# Patient Record
Sex: Female | Born: 1990 | Race: White | Hispanic: No | Marital: Single | Smoking: Never smoker
Health system: Southern US, Community
[De-identification: ages and names within clinical notes are randomized; demographics above are authoritative.]

## PROBLEM LIST (undated history)

## (undated) DIAGNOSIS — R29898 Other symptoms and signs involving the musculoskeletal system: Secondary | ICD-10-CM

## (undated) DIAGNOSIS — R87610 Atypical squamous cells of undetermined significance on cytologic smear of cervix (ASC-US): Secondary | ICD-10-CM

## (undated) DIAGNOSIS — G43909 Migraine, unspecified, not intractable, without status migrainosus: Secondary | ICD-10-CM

## (undated) DIAGNOSIS — M6289 Other specified disorders of muscle: Secondary | ICD-10-CM

## (undated) DIAGNOSIS — A4902 Methicillin resistant Staphylococcus aureus infection, unspecified site: Secondary | ICD-10-CM

## (undated) DIAGNOSIS — J309 Allergic rhinitis, unspecified: Secondary | ICD-10-CM

## (undated) DIAGNOSIS — G808 Other cerebral palsy: Secondary | ICD-10-CM

## (undated) DIAGNOSIS — L309 Dermatitis, unspecified: Secondary | ICD-10-CM

## (undated) HISTORY — DX: Other symptoms and signs involving the musculoskeletal system: R29.898

## (undated) HISTORY — DX: Other cerebral palsy: G80.8

## (undated) HISTORY — DX: Allergic rhinitis, unspecified: J30.9

## (undated) HISTORY — DX: Migraine, unspecified, not intractable, without status migrainosus: G43.909

## (undated) HISTORY — DX: Methicillin resistant Staphylococcus aureus infection, unspecified site: A49.02

## (undated) HISTORY — PX: OTHER SURGICAL HISTORY: SHX169

## (undated) HISTORY — DX: Dermatitis, unspecified: L30.9

## (undated) HISTORY — DX: Other specified disorders of muscle: M62.89

## (undated) HISTORY — DX: Atypical squamous cells of undetermined significance on cytologic smear of cervix (ASC-US): R87.610

---
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
## 2010-12-12 NOTE — Procedures (Signed)
ORDERING PHYSICIAN:  Dr. Ellison Carwin.     Cath   EAV:[id number]  D:  06/13/2004 13:50:06  T:  06/14/2004 08:25:52  Job #:  [id number]

## 2010-12-12 NOTE — Procedures (Signed)
HISTORY:  This is an 20 year old patient with a history of cerebral palsy  with an episode of loss of consciousness for 5 minutes occurred 3 to 4 days  prior to this evaluation.  The patient is being evaluated for possible  seizures.   This is a routine EEG.  No skull defects are noted.   MEDICATIONS:  Not listed.   EEG CLASSIFICATION:  Dysrhythmia grade 3 posterior.   DESCRIPTION OF RECORDING AND BACKGROUND RHYTHMS:  This recording consists of  a fairly well-modulated, medium amplitude alpha rhythm of 8 Hz.  It is  reactive to eye opening and closure.  As the record progresses, the patient  appears to remain in an awakened state throughout the recording.  Photic  stimulation is performed and at 9 Hz stimulation, there appears to be a  paroxysmal discharge associated with sharp and slow wave complexes emanating  primarily from the posterior regions.  True electrographic seizure is not  recorded.  This activity is not seen at other photic stimulation  frequencies.  Hyperventilation results in a minimal buildup of  _________  rhythm activities without significant slowing seen.  EKG monitor shows no  evidence of cardiac rhythm abnormalities with a heart rate of 66.   IMPRESSION:  This is an abnormal EEG recorded to paroxysmal sharp and slow  wave discharges seen during 9 Hz photic stimulation.  This is seen primarily  in the posterior regions and is a study consistent with a lowered seizure  threshold, although no actual electrographic seizures were recorded.      MVH:QION  D:  05/21/2004 11:15:34  T:  05/21/2004 12:17:08  Job #:  [id number]   cc:   Mila Homer. Sudie Bailey, M.D.  [address] [place], [place] [address]  Fax: [phone]

## 2011-03-11 ENCOUNTER — Ambulatory Visit (INDEPENDENT_AMBULATORY_CARE_PROVIDER_SITE_OTHER): Payer: Medicaid Other | Admitting: Orthopedic Surgery

## 2011-03-11 ENCOUNTER — Encounter: Payer: Self-pay | Admitting: Orthopedic Surgery

## 2011-03-11 VITALS — Resp 16 | Ht 63.0 in | Wt 106.0 lb

## 2011-03-11 DIAGNOSIS — S93409A Sprain of unspecified ligament of unspecified ankle, initial encounter: Secondary | ICD-10-CM

## 2011-03-11 NOTE — Patient Instructions (Signed)
Apply weight as tolerated to the LEFT foot  No strenuous activity or excessive walking  Wear ASO brace for all ambulation

## 2011-03-11 NOTE — Progress Notes (Signed)
Chief complaint: Pain LEFT ankle x4 days HPI:(4) The patient was referred from Dr. Tenna Delaine office with a possible avulsion fracture of the cuboid but primarily for ankle sprain evaluation and management.  The patient has hypotonia from previous cerebral palsy.  She injured the LEFT ankle secondary to a fall on August 11.  Her mother says the ankle is swollen like a balloon.  She was placed in an air cast but it doesn't stay on well.  She complains of dull constant ankle pain with bruising and swelling along the lateral foot and ankle.  She is able to bear weight.  ROS:(2) Seasonal ALLERGIES, neurologic symptoms negative  PFSH: (1) Autism, hypotonia from cerebral palsy.  Family history of heart disease diabetes and cancer.  Physical Exam(12) GENERAL: normal grooming and hygiene with normal full body development  CDV: pulses are normal   Skin: normal  Lymph: nodes were not palpable/normal  Psychiatric: awake, alert and oriented  Neuro: normal sensation  MSK Ambulation is noted without assistive device other than the Aircast 1There is pes planus of the foot but normal ankle range of motion.  There is tenderness and swelling and subcutaneous bruising of the skin along the lateral foot and ankle with tenderness noted over the cuboid and anterior talofibular ligament.  The bone itself is nontender.  Plantarflexion and dorsiflexion strength are normal.  There is a large anterior drawer test which may be chronic.   X-ray reevaluation and report including the film on a disc show no fracture there is irregularity along the lateral cuboid Assessment: Grade 2 ankle sprain    Plan: Change the ASO brace, weightbearing as tolerated, decrease activities, followup in 4 weeks reevaluate the ankle and see if therapy needed

## 2011-04-08 ENCOUNTER — Encounter: Payer: Self-pay | Admitting: Orthopedic Surgery

## 2011-04-08 ENCOUNTER — Ambulatory Visit (INDEPENDENT_AMBULATORY_CARE_PROVIDER_SITE_OTHER): Payer: Medicaid Other | Admitting: Orthopedic Surgery

## 2011-04-08 VITALS — Ht 63.0 in | Wt 106.0 lb

## 2011-04-08 DIAGNOSIS — S93409A Sprain of unspecified ligament of unspecified ankle, initial encounter: Secondary | ICD-10-CM

## 2011-04-08 NOTE — Patient Instructions (Signed)
Wear brace for 2 more weeks then return to normal shoe wear no brace needed

## 2011-04-08 NOTE — Progress Notes (Signed)
Chief complaint LEFT ankle sprain followup  History patient injured LEFT ankle 4 weeks ago was placed in a brace there was a calcaneocuboid avulsion as well.  Patient reports symptoms have improved significantly.  Review of systems no catching locking or giving way.  Exam shows she is ambulating without a limp.  She has no tenderness or swelling.  Ankle range of motion is normal.  Muscle strength and muscle tone are returning towards normal.  There is a slight anterior drawer with a firm endpoint.  Distal color and perfusion are normal.  Impression resolving ankle sprain  Secondary to the hypotonia recommend 2 more weeks in the brace and then the brace can be removed.

## 2013-04-02 ENCOUNTER — Encounter (HOSPITAL_COMMUNITY): Payer: Self-pay | Admitting: Emergency Medicine

## 2013-04-02 ENCOUNTER — Emergency Department (HOSPITAL_COMMUNITY)
Admission: EM | Admit: 2013-04-02 | Discharge: 2013-04-02 | Disposition: A | Discharge status: Home / Self Care | Payer: Medicaid Other | Attending: Emergency Medicine | Admitting: Emergency Medicine

## 2013-04-02 DIAGNOSIS — R21 Rash and other nonspecific skin eruption: Secondary | ICD-10-CM | POA: Insufficient documentation

## 2013-04-02 DIAGNOSIS — H109 Unspecified conjunctivitis: Secondary | ICD-10-CM | POA: Insufficient documentation

## 2013-04-02 DIAGNOSIS — F840 Autistic disorder: Secondary | ICD-10-CM | POA: Insufficient documentation

## 2013-04-02 DIAGNOSIS — Z8669 Personal history of other diseases of the nervous system and sense organs: Secondary | ICD-10-CM | POA: Insufficient documentation

## 2013-04-02 MED ORDER — CEPHALEXIN 500 MG PO CAPS: 500.0000 mg | ORAL_CAPSULE | Freq: Four times a day (QID) | ORAL | Status: DC

## 2013-04-02 MED ORDER — KETOROLAC TROMETHAMINE 0.5 % OP SOLN
1.0000 [drp] | Freq: Once | OPHTHALMIC | Status: AC
  Administered 2013-04-02: 1 [drp] via OPHTHALMIC
  Filled 2013-04-02: qty 5

## 2013-04-02 MED ORDER — TOBRAMYCIN 0.3 % OP SOLN
1.0000 [drp] | Freq: Once | OPHTHALMIC | Status: AC
  Administered 2013-04-02: 1 [drp] via OPHTHALMIC
  Filled 2013-04-02: qty 5

## 2013-04-02 NOTE — ED Notes (Signed)
Patient complaining of swelling and itching to eyes bilaterally, worse on left eye.

## 2013-04-02 NOTE — ED Provider Notes (Signed)
CSN: [id number]     Arrival date & time 04/02/13  1921 History   First MD Initiated Contact with Patient 04/02/13 1943     Chief Complaint  Patient presents with  . Eye Problem   (Consider location/radiation/quality/duration/timing/severity/associated sxs/prior Treatment) Patient is a 22 y.o. female presenting with eye problem. The history is provided by the patient and a parent.  Eye Problem Location:  Both Quality: itching, drainage and swelling of the upper eyelids. Severity:  Mild Onset quality:  Gradual Duration:  2 days Timing:  Constant Progression:  Unchanged Chronicity:  New Context: not contact lens problem, not direct trauma, not foreign body and not scratch   Relieved by:  Nothing Worsened by:  Nothing tried Ineffective treatments:  None tried Associated symptoms: crusting, discharge, itching, redness and swelling   Associated symptoms: no blurred vision, no decreased vision, no double vision, no facial rash, no headaches, no inflammation, no nausea, no numbness, no photophobia, no tearing, no tingling, no vomiting and no weakness   Associated symptoms comment:  Patient wears eye glasses denies contact use or visual changes Discharge:    Discharge characteristics: yellow, crusting. Risk factors: not exposed to pinkeye and no recent herpes zoster     Past Medical History  Diagnosis Date  . Hypotonia   . CP (cerebral palsy), hypotonic   . Autistic disorder    History reviewed. No pertinent past surgical history. Family History  Problem Relation Age of Onset  . Heart disease    . Arthritis    . Cancer    . Asthma    . Diabetes     History  Substance Use Topics  . Smoking status: Never Smoker   . Smokeless tobacco: Not on file  . Alcohol Use: No   OB History   Grav Para Term Preterm Abortions TAB SAB Ect Mult Living                 Review of Systems  Constitutional: Negative for fever.  HENT: Negative for congestion, sore throat, facial swelling,  trouble swallowing, neck pain and neck stiffness.   Eyes: Positive for discharge, redness and itching. Negative for blurred vision, double vision, photophobia, pain and visual disturbance.  Gastrointestinal: Negative for nausea and vomiting.  Skin:       Rash to left third finger  Neurological: Negative for dizziness, tingling, syncope, facial asymmetry, speech difficulty, weakness, numbness and headaches.  All other systems reviewed and are negative.    Allergies  Review of patient's allergies indicates no known allergies.  Home Medications   Current Outpatient Rx  Name  Route  Sig  Dispense  Refill  . Multiple Vitamin (MULTIVITAMIN) tablet   Oral   Take 1 tablet by mouth daily.         . cephALEXin (KEFLEX) 500 MG capsule   Oral   Take 1 capsule (500 mg total) by mouth 4 (four) times daily. For 7 days   28 capsule   0    BP 102/64  Pulse 79  Temp(Src) 97.4 F (36.3 C) (Oral)  Resp 14  Ht 5\' 3"  (1.6 m)  Wt 107 lb 8 oz (48.762 kg)  BMI 19.05 kg/m2  SpO2 100%  LMP 03/15/2013 Physical Exam  Nursing note and vitals reviewed. Constitutional: She is oriented to person, place, and time. She appears well-developed and well-nourished. No distress.  HENT:  Head: Normocephalic and atraumatic.  Mouth/Throat: Oropharynx is clear and moist.  Eyes: Conjunctivae and EOM are normal. Pupils are  equal, round, and reactive to light. Lids are everted and swept, no foreign bodies found. Right eye exhibits discharge. Right eye exhibits no chemosis and no hordeolum. No foreign body present in the right eye. Left eye exhibits discharge and exudate. Left eye exhibits no chemosis and no hordeolum. No foreign body present in the left eye. No scleral icterus.  Yellow drainage and crusting to the bilateral medial canthus.  Slight edema of the bilateral upper eyelids.  Pt is actively rubbing at her eyes.  Left upper eyelid is excoriated.  Neck: Normal range of motion. Neck supple.  Cardiovascular:  Normal rate, regular rhythm, normal heart sounds and intact distal pulses.   No murmur heard. Pulmonary/Chest: Effort normal and breath sounds normal. No respiratory distress.  Musculoskeletal: Normal range of motion.  Lymphadenopathy:    She has no cervical adenopathy.  Neurological: She is alert and oriented to person, place, and time. She exhibits normal muscle tone. Coordination normal.  Skin: Skin is warm. Rash noted.  Erythematous macular rash to the base of medial left third finger.  Excoriated.  No edema, drainage.  Sensation intact.  Pt has full ROM of the finger    ED Course  Procedures (including critical care time) Labs Review Labs Reviewed - No data to display Imaging Review No results found.  MDM    Visual Acuity - (corrected)  Bilateral Distance: 20/40 ; R Distance: 20/40 ; L Distance: 20/40    1. Conjunctivitis    Patient denies pain to her eyes or visual changes.  Tobramycin and Acular ophthalmic drops dispensed, patient agrees to warm compresses to her eyes and will follow-up with her PMD for recheck and her ophthalmologist in [place].  Mother states she has hx of eczema and uses a eczema cream, will also prescribe bactrim for possible secondary infection from scratching.     Pt is well appearing and stable for discharge.     Damani Kelemen L. Boubacar Lerette, PA-C 04/02/13 2356

## 2013-04-02 NOTE — ED Notes (Signed)
Patient given discharge instruction, verbalized understand. Patient ambulatory out of the department with Mother  

## 2013-04-02 NOTE — ED Notes (Signed)
Pt denies cough, cold or congestion, onset 2 days ago, crusted yellow drainage

## 2013-04-04 NOTE — ED Provider Notes (Signed)
Medical screening examination/treatment/procedure(s) were performed by non-physician practitioner and as supervising physician I was immediately available for consultation/collaboration.   Montgomery Favor M Yamilee Harmes, DO 04/04/13 2217 

## 2014-07-17 DIAGNOSIS — A4902 Methicillin resistant Staphylococcus aureus infection, unspecified site: Secondary | ICD-10-CM

## 2014-07-17 HISTORY — DX: Methicillin resistant Staphylococcus aureus infection, unspecified site: A49.02

## 2015-09-18 DIAGNOSIS — G43909 Migraine, unspecified, not intractable, without status migrainosus: Secondary | ICD-10-CM

## 2015-09-18 HISTORY — DX: Migraine, unspecified, not intractable, without status migrainosus: G43.909

## 2016-09-21 ENCOUNTER — Other Ambulatory Visit (HOSPITAL_COMMUNITY): Payer: Self-pay | Admitting: Family Medicine

## 2016-09-21 DIAGNOSIS — I519 Heart disease, unspecified: Secondary | ICD-10-CM

## 2016-10-01 ENCOUNTER — Ambulatory Visit (HOSPITAL_COMMUNITY)
Admission: RE | Admit: 2016-10-01 | Discharge: 2016-10-01 | Disposition: A | Discharge status: Home / Self Care | Payer: Medicaid Other | Source: Ambulatory Visit | Attending: Family Medicine | Admitting: Family Medicine

## 2016-10-01 DIAGNOSIS — I519 Heart disease, unspecified: Secondary | ICD-10-CM | POA: Diagnosis not present

## 2016-10-01 DIAGNOSIS — I341 Nonrheumatic mitral (valve) prolapse: Secondary | ICD-10-CM | POA: Diagnosis not present

## 2016-10-01 NOTE — Progress Notes (Signed)
*  PRELIMINARY RESULTS* Echocardiogram 2D Echocardiogram has been performed.  Jeryl Columbialliott, Antrell Tipler 10/01/2016, 2:31 PM

## 2017-04-01 ENCOUNTER — Encounter: Payer: Self-pay | Admitting: *Deleted

## 2017-04-02 ENCOUNTER — Ambulatory Visit (INDEPENDENT_AMBULATORY_CARE_PROVIDER_SITE_OTHER): Payer: Medicaid Other | Admitting: Adult Health

## 2017-04-02 ENCOUNTER — Encounter: Payer: Self-pay | Admitting: Adult Health

## 2017-04-02 ENCOUNTER — Encounter (INDEPENDENT_AMBULATORY_CARE_PROVIDER_SITE_OTHER): Payer: Self-pay

## 2017-04-02 ENCOUNTER — Other Ambulatory Visit (HOSPITAL_COMMUNITY)
Admission: RE | Admit: 2017-04-02 | Discharge: 2017-04-02 | Disposition: A | Discharge status: Home / Self Care | Payer: Medicaid Other | Source: Ambulatory Visit | Attending: Adult Health | Admitting: Adult Health

## 2017-04-02 VITALS — BP 94/58 | HR 88 | Ht 64.0 in | Wt 107.0 lb

## 2017-04-02 DIAGNOSIS — Z01419 Encounter for gynecological examination (general) (routine) without abnormal findings: Secondary | ICD-10-CM | POA: Diagnosis not present

## 2017-04-02 DIAGNOSIS — Z0000 Encounter for general adult medical examination without abnormal findings: Secondary | ICD-10-CM

## 2017-04-02 DIAGNOSIS — Z124 Encounter for screening for malignant neoplasm of cervix: Secondary | ICD-10-CM

## 2017-04-02 NOTE — Addendum Note (Signed)
Addended by: Tish FredericksonLANCASTER, Renesme Kerrigan A on: 04/02/2017 11:13 AM   Modules accepted: Orders

## 2017-04-02 NOTE — Patient Instructions (Signed)
Pap and physical in 3 years, if pap normal

## 2017-04-02 NOTE — Progress Notes (Signed)
Patient ID: Joanna PaymentBrittany M Fisher, female   DOB: 1991-03-29, 26 y.o.   MRN: [id number] History of Present Illness: Joanna FosterBrittany is a 26 year old white female in for a well woman gyn exam and first pap, she is not sexually active, and she has autism.Her Mom is with her. PCP is Dr Sudie BaileyKnowlton.   Current Medications, Allergies, Past Medical History, Past Surgical History, Family History and Social History were reviewed in [facility] electronic medical record.     Review of Systems: Patient denies any daily headaches(but has migraines at times), hearing loss, fatigue, blurred vision, shortness of breath, chest pain, abdominal pain, problems with bowel movements(constipated at times), urination, or intercourse(never had sex). No joint pain or mood swings. Periods are good, she uses pads, no cramps.    Physical Exam:BP (!) 94/58 (BP Location: Right Arm, Patient Position: Sitting, Cuff Size: Normal)   Pulse 88   Ht 5\' 4"  (1.626 m)   Wt 107 lb (48.5 kg)   LMP 03/26/2017 (Exact Date)   BMI 18.37 kg/m  General:  Well developed, well nourished, no acute distress Skin:  Warm and dry Neck:  Midline trachea, normal thyroid, good ROM, no lymphadenopathy Lungs; Clear to auscultation bilaterally Breast:  No dominant palpable mass, retraction, or nipple discharge Cardiovascular: Regular rate and rhythm Abdomen:  Soft, non tender, no hepatosplenomegaly Pelvic:  External genitalia is normal in appearance, no lesions.  The vagina is normal in appearance. Urethra has no lesions or masses. The cervix is not visualized, pap performed with blind sweep, used pediatric speculum.  Uterus is felt to be normal size, shape, and contour.  No adnexal masses or tenderness noted.Bladder is non tender, no masses felt. Extremities/musculoskeletal:  No swelling or varicosities noted, no clubbing or cyanosis Psych:  No mood changes, alert and cooperative,seems happy PHQ 2 score 0.  Impression: 1. Encounter for gynecological  examination with Papanicolaou smear of cervix       Plan: Try 1/2 cup real oatmear,1/2 cup applesauce and 1./2 cup prune juice every day, or if not juice 4-5 prunes. And try to increase fluids Pap and physical in 3 years, if this pap normal.

## 2017-04-08 ENCOUNTER — Encounter: Payer: Self-pay | Admitting: Adult Health

## 2017-04-08 ENCOUNTER — Telehealth: Payer: Self-pay | Admitting: Adult Health

## 2017-04-08 DIAGNOSIS — R87610 Atypical squamous cells of undetermined significance on cytologic smear of cervix (ASC-US): Secondary | ICD-10-CM | POA: Insufficient documentation

## 2017-04-08 HISTORY — DX: Atypical squamous cells of undetermined significance on cytologic smear of cervix (ASC-US): R87.610

## 2017-04-08 LAB — CYTOLOGY - PAP
Diagnosis: UNDETERMINED — AB
HPV: NOT DETECTED

## 2017-04-08 NOTE — Telephone Encounter (Signed)
Left message to call me back, if she calls let her know pap was ASCUS, but negative for HPV, so need to repeat pap in 1 year

## 2017-04-09 ENCOUNTER — Telehealth: Payer: Self-pay | Admitting: *Deleted

## 2017-04-09 NOTE — Telephone Encounter (Signed)
Informed patient's mother that pap was ASCUS, but negative for HPV, so need to repeat pap in 1 year. Verbalized understanding.

## 2017-10-11 ENCOUNTER — Encounter: Payer: Self-pay | Admitting: Gastroenterology

## 2017-12-14 ENCOUNTER — Ambulatory Visit (INDEPENDENT_AMBULATORY_CARE_PROVIDER_SITE_OTHER): Payer: Medicaid Other | Admitting: Nurse Practitioner

## 2017-12-14 ENCOUNTER — Encounter: Payer: Self-pay | Admitting: Nurse Practitioner

## 2017-12-14 DIAGNOSIS — R898 Other abnormal findings in specimens from other organs, systems and tissues: Secondary | ICD-10-CM | POA: Insufficient documentation

## 2017-12-14 DIAGNOSIS — R945 Abnormal results of liver function studies: Secondary | ICD-10-CM

## 2017-12-14 DIAGNOSIS — R7989 Other specified abnormal findings of blood chemistry: Secondary | ICD-10-CM | POA: Insufficient documentation

## 2017-12-14 NOTE — Progress Notes (Signed)
Primary Care Physician:  Gareth Morgan, MD Primary Gastroenterologist:  Dr. Darrick Penna  Chief Complaint  Patient presents with  . other    abnormal labs    HPI:   Joanna Fisher is a 27 y.o. female who presents on referral from primary care for abnormal labs.  Information received with the referral from primary care reviewed, including office visit dated 10/05/2017 to discuss lab results.  At that time noted hemoglobin on 04/02/2017 to be 13.6.  Repeat/follow-up metabolic panel found slightly elevated bilirubin of 1.3, with 1.2 being upper limit normal.  TSH normal.  Normal iron, iron binding capacity, ferritin (73) with mild increase in iron saturation of 55%.  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  The patient was subsequently referred to GI.  Today she is accompanied by her father. She states she's doing ok overall. Denies abdominal pain, N/V (unless triggered by migraine). Denies any rectal bleeding, melena, yellowing of skin/eyes, acute episodic confusion, darkened urine, tremors, shakes. Denies fever, chills, unintentional weight loss. Notes intermittent constipation. Denies chest pain, dyspnea, dizziness, lightheadedness, syncope, near syncope. Denies any other upper or lower GI symptoms.  Past Medical History:  Diagnosis Date  . Allergic rhinitis   . ASCUS of cervix with negative high risk HPV 04/08/2017   Repeat in 1 year   . Autistic disorder   . CP (cerebral palsy), hypotonic (HCC)   . Hypotonia   . Migraine 09/18/2015  . MRSA (methicillin resistant Staphylococcus aureus) 07/17/2014    Past Surgical History:  Procedure  Laterality Date  . NONE TO DATE     As of 12/14/17    Current Outpatient Medications  Medication Sig Dispense Refill  . betamethasone valerate (VALISONE) 0.1 % cream Apply topically 2 (two) times daily.    Marland Kitchen CALCIUM-VITAMIN D PO Take by mouth.    . loratadine (CLARITIN) 10 MG tablet Take 10 mg by mouth daily as needed for allergies.    . Multiple Vitamin (MULTIVITAMIN) tablet Take 1 tablet by mouth daily.    . SUMAtriptan (IMITREX) 50 MG tablet Take 50 mg by mouth every 2 (two) hours as needed for migraine. May repeat in 2 hours if headache persists or recurs.     No current facility-administered medications for this visit.     Allergies as of 12/14/2017  . (No Known Allergies)    Family History  Problem Relation Age of Onset  . Heart disease Unknown   . Arthritis Unknown   . Cancer Unknown   . Asthma Unknown   . Diabetes Unknown   . Dementia Paternal Grandfather   . Heart failure Paternal Grandfather   . Diabetes Paternal Grandmother   . Hypertension Paternal Grandmother   . Fibromyalgia Paternal Grandmother   . COPD Paternal Grandmother   . Heart disease Maternal Grandmother   . Heart disease Maternal Grandfather   . Liver cancer Maternal Grandfather        liver  . Hypertension Father   . Hypertension Mother   . Heart attack Mother   . Other Mother        Hemachromatosis carrier  . Hypertension Brother   . Hyperlipidemia Brother  Social History   Socioeconomic History  . Marital status: Single    Spouse name: Not on file  . Number of children: Not on file  . Years of education: 12th grade  . Highest education level: Not on file  Occupational History  . 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 Financial resource strain: Not on file  . Food insecurity:    Worry: Not on file    Inability: Not on file  . Transportation needs:    Medical: Not on file    Non-medical: Not on file  Tobacco Use  . Smoking status: Never Smoker  .  Smokeless tobacco: Never Used  Substance and Sexual Activity  . Alcohol use: No  . Drug use: No  . Sexual activity: Never  Lifestyle  . Physical activity:    Days per week: Not on file    Minutes per session: Not on file  . Stress: Not on file  Relationships  . Social connections:    Talks on phone: Not on file    Gets together: Not on file    Attends religious service: Not on file    Active member of club or organization: Not on file    Attends meetings of clubs or organizations: Not on file    Relationship status: Not on file  . Intimate partner violence:    Fear of current or ex partner: Not on file    Emotionally abused: Not on file    Physically abused: Not on file    Forced sexual activity: Not on file  Other Topics Concern  . Not on file  Social History Narrative  . Not on file    Review of Systems: Complete ROS negative except as per HPI.   Physical Exam: BP 98/66   Pulse 80   Temp (!) 96.9 F (36.1 C) (Oral)   Ht  (1.575 m)   Wt 114 lb 3.2 oz (51.8 kg)   LMP  (Within Days)   BMI 20.89 kg/m  General:   Alert and oriented. Pleasant and cooperative. Well-nourished and well-developed.  Head:  Normocephalic and atraumatic. Eyes:  Without icterus, sclera clear and conjunctiva pink.  Ears:  Normal auditory acuity. Cardiovascular:  S1, S2 present without murmurs appreciated. Extremities without clubbing or edema. Respiratory:  Clear to auscultation bilaterally. No wheezes, rales, or rhonchi. No distress.  Gastrointestinal:  +BS, soft, non-tender and non-distended. No HSM noted. No guarding or rebound. No masses appreciated.  Rectal:  Deferred  Musculoskalatal:  Symmetrical without gross deformities. Skin:  Intact without significant lesions or rashes. Neurologic:  Alert and oriented x4;  grossly normal neurologically. Psych:  Alert and cooperative. Normal mood and affect. Heme/Lymph/Immune: No excessive bruising noted.    12/14/2017 8:37  AM   Disclaimer: This note was dictated with voice recognition software. Similar sounding words can inadvertently be transcribed and may not be corrected upon review.

## 2017-12-14 NOTE — Assessment & Plan Note (Signed)
The patient had a normal ferritin.  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  At this point with her follow-up liver testing I will recheck ferritin.  She is generally asymptomatic and may never have symptoms of hemochromatosis based on her mutation with a 0.5 to 2% development of symptoms or clinical evidence of hereditary hemochromatosis in individuals with this genotype mix.  Follow-up in 3 months.

## 2017-12-14 NOTE — Assessment & Plan Note (Signed)
Based on office visit note the patient had mild elevation of bilirubin.  There is a family history of liver cancer.  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  However, this will likely need to be monitored as per below.  I think today we will recheck some labs including CBC, BMP, HFP, INR, ferritin.  We will also perform right upper quadrant ultrasound for any anatomic abnormalities or suggestion of scarring/fibrosis.  Pending results she may require further testing including autoimmune serologies, genetics for Gilbert syndrome, and possibly liver biopsy in the future.  Return for follow-up in 3 months.

## 2017-12-14 NOTE — Patient Instructions (Signed)
1. Have your lab tests checked when you are able to. 2. We will call you with the results. 3. We will help you schedule your ultrasound of your liver. 4. Return for follow-up in 3 months. 5. Call us if you have any questions or concerns.  At [facility] we value your feedback. You may receive a survey about your visit today. Please share your experience as we strive to create trusting relationships with our patients to provide genuine, compassionate, quality care.  It was great to meet you both today!  It always nice to meet other [place].  I hope you have a wonderful summer!!

## 2017-12-15 ENCOUNTER — Telehealth: Payer: Self-pay | Admitting: *Deleted

## 2017-12-15 NOTE — Progress Notes (Signed)
CC'D TO PCP °

## 2017-12-15 NOTE — Telephone Encounter (Signed)
Called patient and was advised to speak with her father. Aware Ultrasound is scheduled for 12/21/17 at 8:30am, arrival time 8:15am, npo after midnight.  PA submitted via [employer] website and PA is pending #[id number]

## 2017-12-17 LAB — BASIC METABOLIC PANEL
BUN: 9 mg/dL (ref 7–25)
CO2: 28 mmol/L (ref 20–32)
Calcium: 9.6 mg/dL (ref 8.6–10.2)
Chloride: 103 mmol/L (ref 98–110)
Creat: 0.54 mg/dL (ref 0.50–1.10)
Glucose, Bld: 82 mg/dL (ref 65–139)
Potassium: 4.4 mmol/L (ref 3.5–5.3)
Sodium: 139 mmol/L (ref 135–146)

## 2017-12-17 LAB — CBC WITH DIFFERENTIAL/PLATELET
Basophils Absolute: 70 cells/uL (ref 0–200)
Basophils Relative: 1.6 %
Eosinophils Absolute: 202 cells/uL (ref 15–500)
Eosinophils Relative: 4.6 %
HCT: 38.6 % (ref 35.0–45.0)
Hemoglobin: 13.4 g/dL (ref 11.7–15.5)
Lymphs Abs: 1122 cells/uL (ref 850–3900)
MCH: 32.9 pg (ref 27.0–33.0)
MCHC: 34.7 g/dL (ref 32.0–36.0)
MCV: 94.8 fL (ref 80.0–100.0)
MPV: 8.9 fL (ref 7.5–12.5)
Monocytes Relative: 10.1 %
Neutro Abs: 2561 cells/uL (ref 1500–7800)
Neutrophils Relative %: 58.2 %
Platelets: 381 10*3/uL (ref 140–400)
RBC: 4.07 10*6/uL (ref 3.80–5.10)
RDW: 11.6 % (ref 11.0–15.0)
Total Lymphocyte: 25.5 %
WBC mixed population: 444 cells/uL (ref 200–950)
WBC: 4.4 10*3/uL (ref 3.8–10.8)

## 2017-12-17 LAB — HEPATIC FUNCTION PANEL
AG Ratio: 1.6 (calc) (ref 1.0–2.5)
ALT: 33 U/L — ABNORMAL HIGH (ref 6–29)
AST: 24 U/L (ref 10–30)
Albumin: 4.2 g/dL (ref 3.6–5.1)
Alkaline phosphatase (APISO): 53 U/L (ref 33–115)
Bilirubin, Direct: 0.2 mg/dL (ref 0.0–0.2)
Globulin: 2.6 g/dL (calc) (ref 1.9–3.7)
Indirect Bilirubin: 1.1 mg/dL (calc) (ref 0.2–1.2)
Total Bilirubin: 1.3 mg/dL — ABNORMAL HIGH (ref 0.2–1.2)
Total Protein: 6.8 g/dL (ref 6.1–8.1)

## 2017-12-17 LAB — FERRITIN: Ferritin: 76 ng/mL (ref 10–154)

## 2017-12-17 LAB — PROTIME-INR
INR: 1
Prothrombin Time: 10.6 s (ref 9.0–11.5)

## 2017-12-17 NOTE — Telephone Encounter (Signed)
PA for ultrasound was approved. [id number] dates 12/15/17-01/14/18

## 2017-12-21 ENCOUNTER — Ambulatory Visit (HOSPITAL_COMMUNITY): Admission: RE | Admit: 2017-12-21 | Payer: Medicaid Other | Source: Ambulatory Visit

## 2017-12-27 ENCOUNTER — Ambulatory Visit (HOSPITAL_COMMUNITY)
Admission: RE | Admit: 2017-12-27 | Discharge: 2017-12-27 | Disposition: A | Discharge status: Home / Self Care | Payer: Medicaid Other | Source: Ambulatory Visit | Attending: Nurse Practitioner | Admitting: Nurse Practitioner

## 2017-12-27 DIAGNOSIS — R945 Abnormal results of liver function studies: Secondary | ICD-10-CM | POA: Diagnosis present

## 2017-12-27 DIAGNOSIS — R7989 Other specified abnormal findings of blood chemistry: Secondary | ICD-10-CM

## 2017-12-27 DIAGNOSIS — R898 Other abnormal findings in specimens from other organs, systems and tissues: Secondary | ICD-10-CM | POA: Diagnosis present

## 2017-12-28 NOTE — Progress Notes (Signed)
Pt's father is aware.

## 2017-12-28 NOTE — Progress Notes (Signed)
PT's father is aware of results.  

## 2018-03-16 ENCOUNTER — Encounter: Payer: Self-pay | Admitting: Gastroenterology

## 2018-03-16 ENCOUNTER — Ambulatory Visit: Payer: Medicaid Other | Admitting: Gastroenterology

## 2018-03-16 VITALS — BP 105/69 | HR 80 | Temp 97.2°F | Ht 62.0 in | Wt 116.2 lb

## 2018-03-16 DIAGNOSIS — R945 Abnormal results of liver function studies: Secondary | ICD-10-CM

## 2018-03-16 DIAGNOSIS — R7989 Other specified abnormal findings of blood chemistry: Secondary | ICD-10-CM

## 2018-03-16 NOTE — Progress Notes (Signed)
Referring Provider: Gareth MorganKnowlton, Steve, MD Primary Care Physician:  Gareth MorganKnowlton, Steve, MD  Primary GI: Dr. Darrick PennaFields   Chief Complaint  Patient presents with  . elevated LFTs    HPI:   Joanna Fisher is a 27 y.o. 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  RUQ US normal in June 2019.  She is present today with her mother. No abdominal pain, N/V. Good appetite. Migraines occasionally. No concerns.   Past Medical History:  Diagnosis Date  . Allergic rhinitis   . ASCUS of cervix with negative high risk HPV 04/08/2017   Repeat in 1 year   . Autistic disorder   . CP (cerebral palsy), hypotonic (HCC)   . Hypotonia   . Migraine 09/18/2015  . MRSA (methicillin resistant Staphylococcus aureus) 07/17/2014    Past Surgical History:  Procedure Laterality Date  . NONE TO DATE     As of 12/14/17    Current Outpatient Medications  Medication Sig Dispense Refill  . betamethasone valerate (VALISONE) 0.1 % cream Apply topically as needed.     Marland Kitchen. CALCIUM-VITAMIN D PO Take by mouth.    . cetirizine (ZYRTEC) 10 MG tablet Take 10 mg by mouth daily.    . Multiple Vitamin (MULTIVITAMIN) tablet Take 1 tablet by mouth daily.    . SUMAtriptan (IMITREX) 50 MG tablet Take 50 mg by mouth every 2 (two) hours as needed for migraine. May repeat in 2 hours if headache persists or recurs.    Marland Kitchen. loratadine (CLARITIN) 10 MG tablet Take 10 mg by mouth daily as needed for allergies.     No current facility-administered medications for this visit.     Allergies as of 03/16/2018  . (No Known Allergies)    Family History  Problem Relation Age of Onset  . Heart disease Unknown   . Arthritis Unknown   . Cancer Unknown   . Asthma Unknown   . Diabetes Unknown   . Dementia Paternal Grandfather   . Heart failure Paternal  Grandfather   . Diabetes Paternal Grandmother   . Hypertension Paternal Grandmother   . Fibromyalgia Paternal Grandmother   . COPD Paternal Grandmother   . Heart disease Maternal Grandmother   . Heart disease Maternal Grandfather   . Liver cancer Maternal Grandfather        liver  . Hypertension Father   . Hypertension Mother   . Heart attack Mother   . Other Mother        Hemachromatosis carrier  . Hypertension Brother   . Hyperlipidemia Brother     Social History   Socioeconomic History  . Marital status: Single    Spouse name: Not on file  . Number of children: Not on file  . Years of education: 12th grade  . Highest education level: Not on file  Occupational History  . Occupation: na    Associate Professormployer: not employed  Social Needs  . Financial resource strain: Not on file  . Food insecurity:    Worry: Not on file    Inability: Not on file  . Transportation needs:    Medical: Not on file    Non-medical: Not on file  Tobacco Use  . Smoking status: Never Smoker  . Smokeless tobacco: Never Used  Substance and Sexual Activity  . Alcohol use: No  . Drug use: No  .  Sexual activity: Never  Lifestyle  . Physical activity:    Days per week: Not on file    Minutes per session: Not on file  . Stress: Not on file  Relationships  . Social connections:    Talks on phone: Not on file    Gets together: Not on file    Attends religious service: Not on file    Active member of club or organization: Not on file    Attends meetings of clubs or organizations: Not on file    Relationship status: Not on file  Other Topics Concern  . Not on file  Social History Narrative  . Not on file    Review of Systems: Gen: Denies fever, chills, anorexia. Denies fatigue, weakness, weight loss.  CV: Denies chest pain, palpitations, syncope, peripheral edema, and claudication. Resp: Denies dyspnea at rest, cough, wheezing, coughing up blood, and pleurisy. GI: see HPI  Derm: Denies rash,  itching, dry skin Psych: Denies depression, anxiety, memory loss, confusion. No homicidal or suicidal ideation.  Heme: Denies bruising, bleeding, and enlarged lymph nodes.  Physical Exam: BP 105/69   Pulse 80   Temp (!) 97.2 F (36.2 C) (Oral)   Ht 5\' 2"  (1.575 m)   Wt 116 lb 3.2 oz (52.7 kg)   LMP 03/02/2018 (Approximate)   BMI 21.25 kg/m  General:   Alert and oriented. No distress noted. Head:  Normocephalic and atraumatic. Eyes:  Conjuctiva clear without scleral icterus. Mouth:  Oral mucosa pink and moist.  Abdomen:  +BS, soft, non-tender and non-distended. No rebound or guarding. No HSM or masses noted. Extremities:  Without edema. Psych:  Alert and cooperative. Normal mood and affect.

## 2018-03-16 NOTE — Patient Instructions (Signed)
It was good to meet you today!  Please have blood work completed when you are able.  We will be in touch shortly with results.  It was a pleasure to see you today. I strive to create trusting relationships with patients to provide genuine, compassionate, and quality care. I value your feedback. If you receive a survey regarding your visit,  I greatly appreciate you taking time to fill this out.   Gelene MinkAnna W. Boone, PhD, ANP-BC [facility]

## 2018-03-21 NOTE — Assessment & Plan Note (Signed)
27 year old female with elevated Tsat at 755 but normal ferritin at 73, found to be compound heterozygous for the C282y and H63D mutations. She needs close monitoring with repeat iron studies, HFP, and possible referral to Hematology in near future. Limited RUQ US normal. HFP, iron studies ordered today. Further recommendations to follow.

## 2018-03-22 NOTE — Progress Notes (Signed)
cc'd to pcp 

## 2019-12-02 IMAGING — US US ABDOMEN LIMITED
1 series · 14 of 25 positions shown · non-contrast
Comparison: None.

CLINICAL DATA: 27-year-old female with cerebral palsy. Elevated
LFTs. Initial encounter.

EXAM:
ULTRASOUND ABDOMEN LIMITED RIGHT UPPER QUADRANT

[Series 1: us abdomen limited · 0.18mm/px · 14 of 45 slices shown]
[im 1/45]
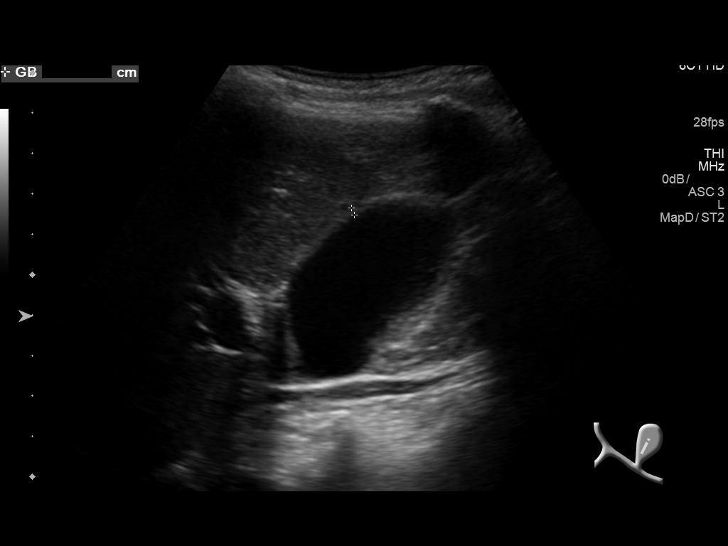
[im 4/45]
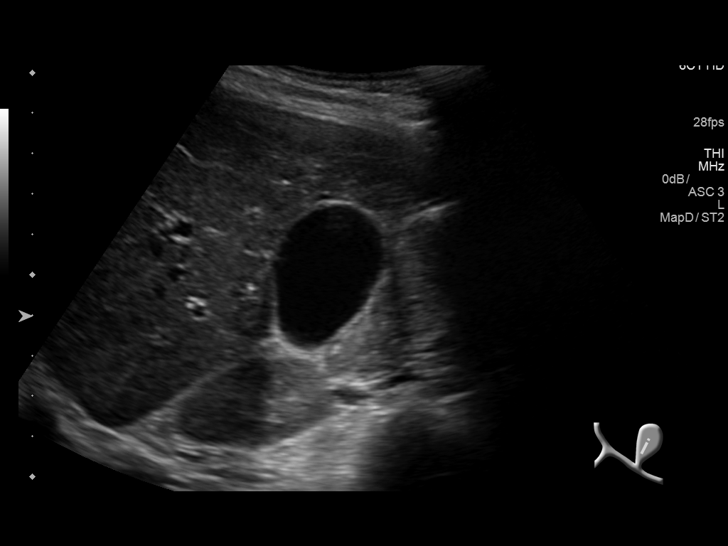
[im 8/45]
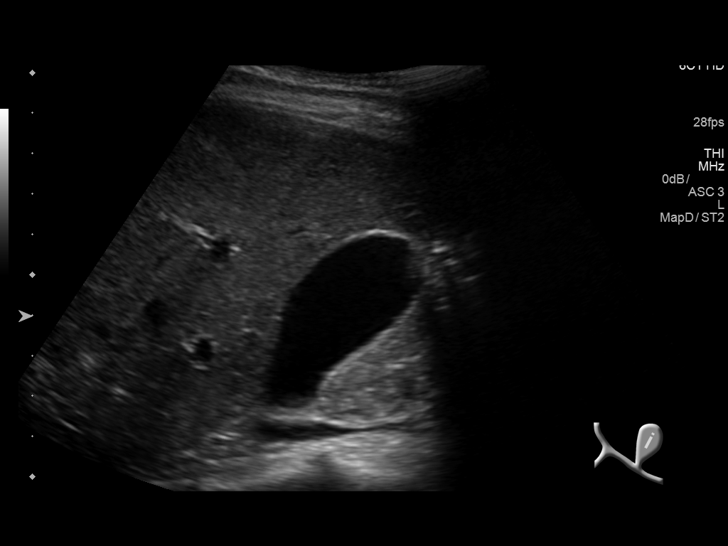
[im 12/45]
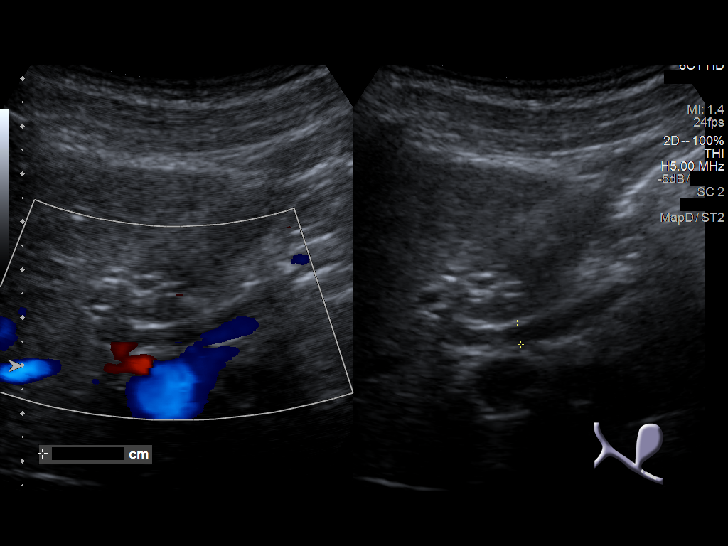
[im 15/45]
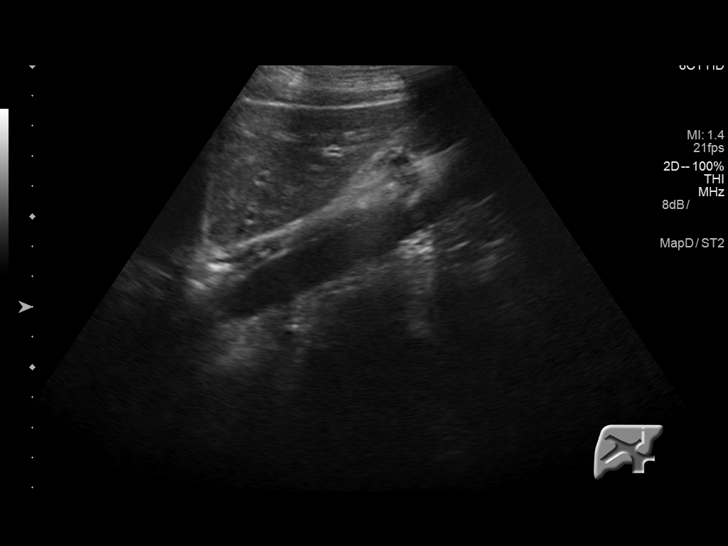
[im 17/45]
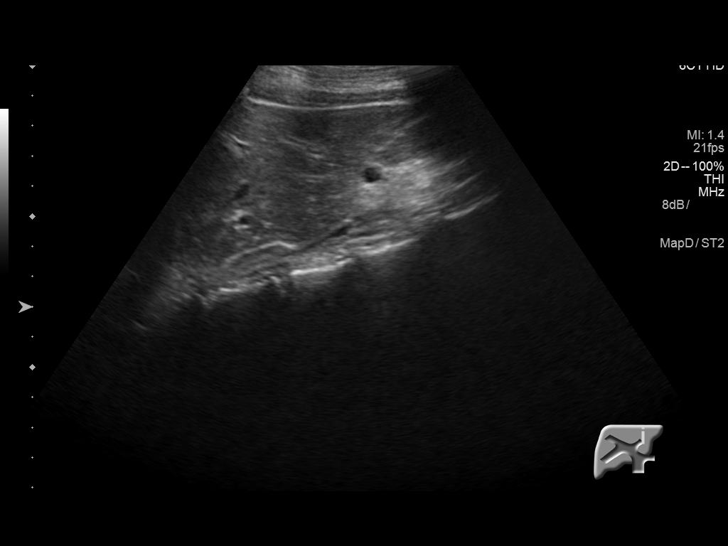
[im 21/45]
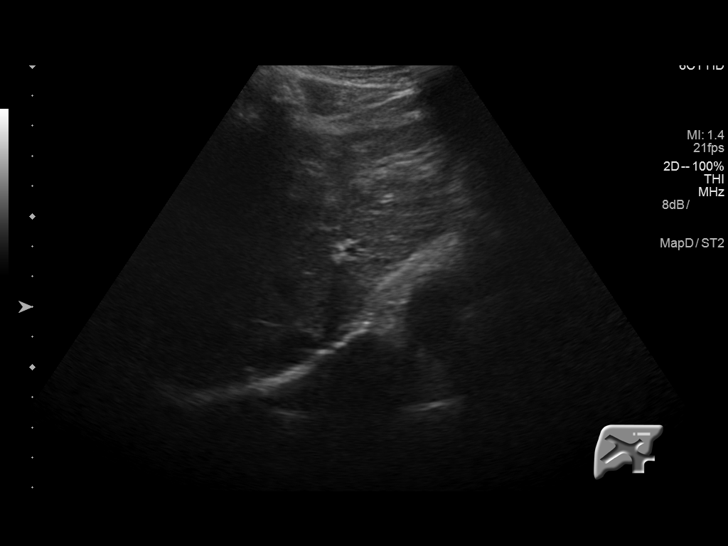
[im 24/45]
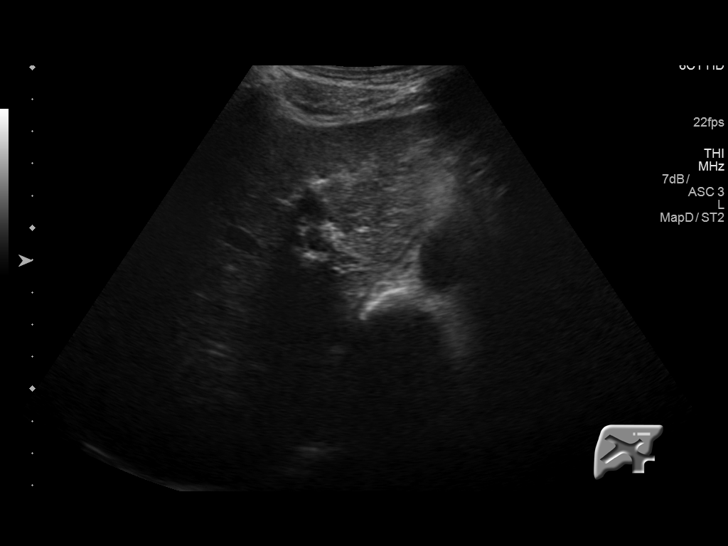
[im 28/45]
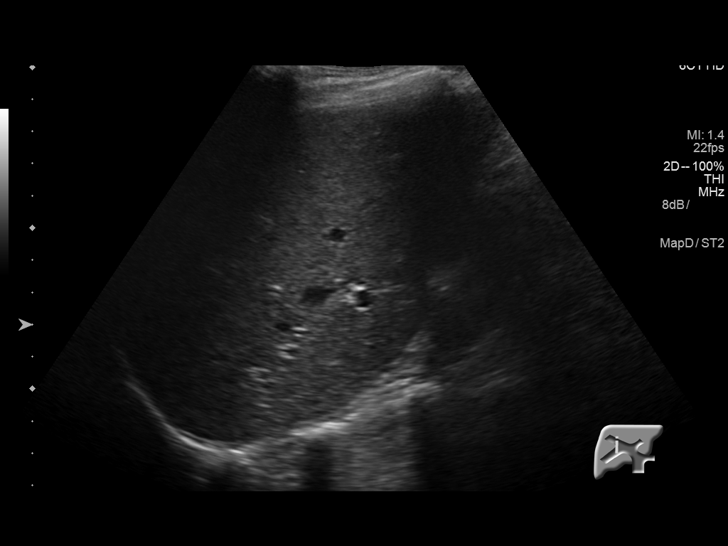
[im 30/45]
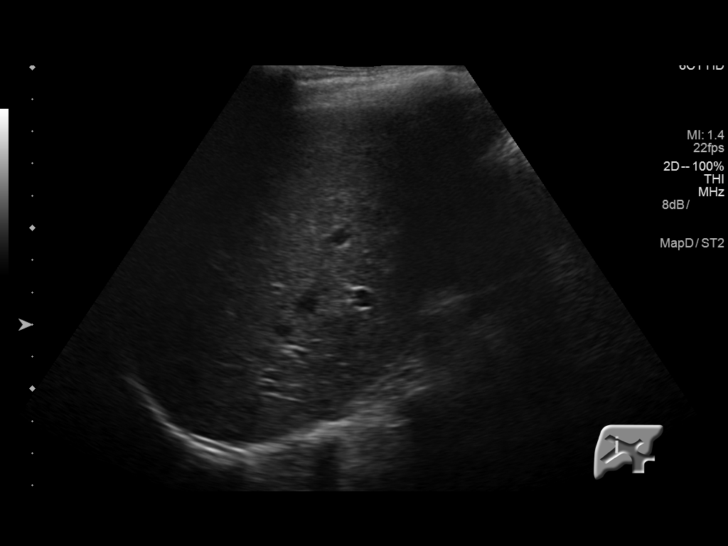
[im 34/45]
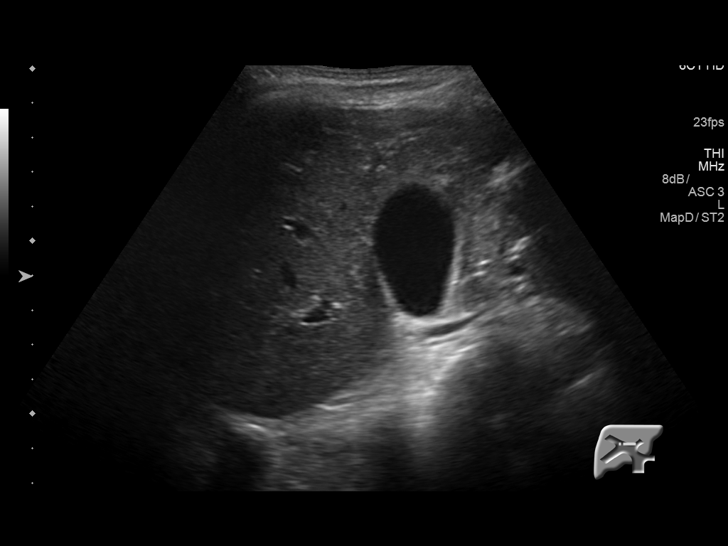
[im 37/45]
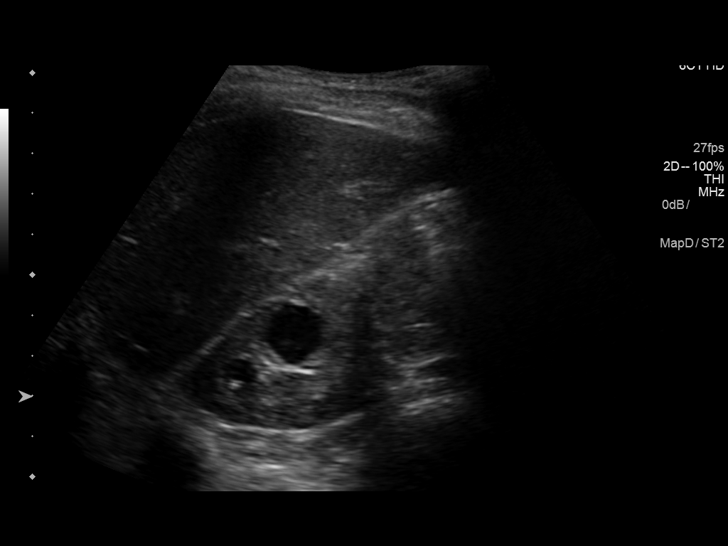
[im 41/45]
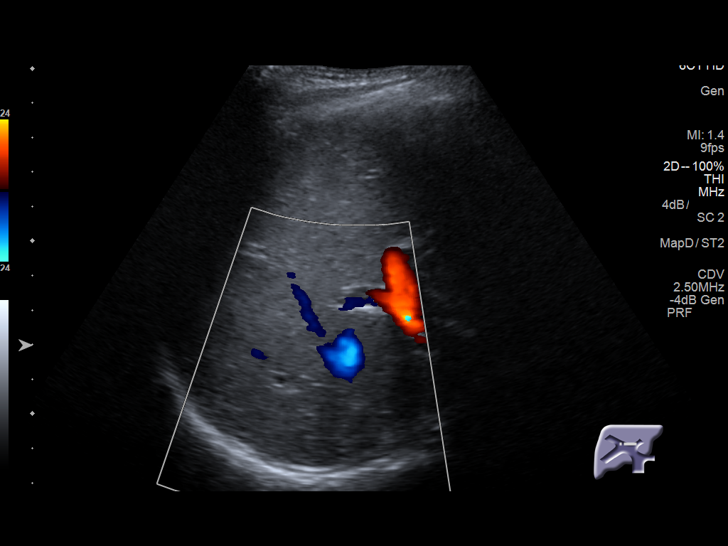
[im 45/45]
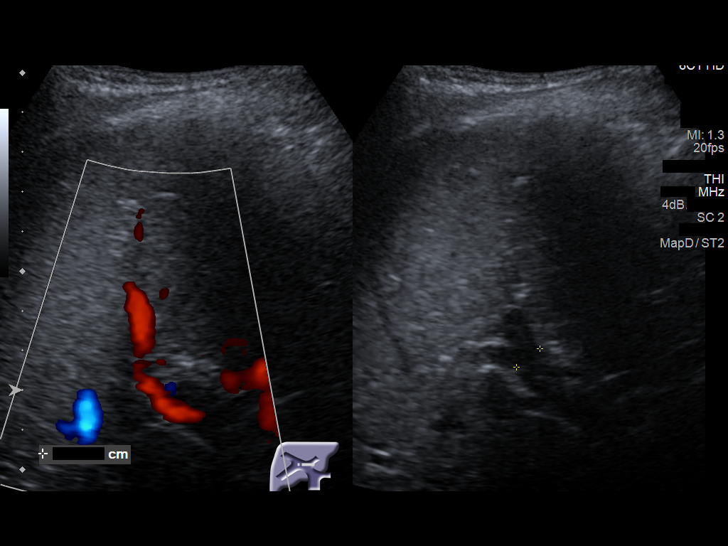

[14 of 25 positions shown; findings below may reference images not displayed]

FINDINGS: Gallbladder:

No gallstones or wall thickening visualized. No sonographic Murphy
sign noted by sonographer.

Common bile duct:

Diameter: 4.5 mm

Liver:

No focal lesion identified. Within normal limits in parenchymal
echogenicity. Portal vein is patent on color Doppler imaging with
normal direction of blood flow towards the liver.

Right upper pole renal cysts incidentally noted.
IMPRESSION: Negative right upper quadrant ultrasound.

## 2020-10-28 ENCOUNTER — Encounter: Payer: Self-pay | Admitting: Nurse Practitioner

## 2020-10-28 ENCOUNTER — Other Ambulatory Visit: Payer: Self-pay

## 2020-10-28 ENCOUNTER — Ambulatory Visit (INDEPENDENT_AMBULATORY_CARE_PROVIDER_SITE_OTHER): Payer: Medicare Other | Admitting: Nurse Practitioner

## 2020-10-28 VITALS — BP 98/63 | HR 94 | Temp 98.5°F | Resp 20 | Ht 62.0 in | Wt 113.0 lb

## 2020-10-28 DIAGNOSIS — R87610 Atypical squamous cells of undetermined significance on cytologic smear of cervix (ASC-US): Secondary | ICD-10-CM

## 2020-10-28 DIAGNOSIS — Z139 Encounter for screening, unspecified: Secondary | ICD-10-CM

## 2020-10-28 DIAGNOSIS — G43909 Migraine, unspecified, not intractable, without status migrainosus: Secondary | ICD-10-CM | POA: Insufficient documentation

## 2020-10-28 DIAGNOSIS — J0100 Acute maxillary sinusitis, unspecified: Secondary | ICD-10-CM | POA: Diagnosis not present

## 2020-10-28 DIAGNOSIS — Z7689 Persons encountering health services in other specified circumstances: Secondary | ICD-10-CM | POA: Diagnosis not present

## 2020-10-28 DIAGNOSIS — Z13220 Encounter for screening for lipoid disorders: Secondary | ICD-10-CM | POA: Insufficient documentation

## 2020-10-28 DIAGNOSIS — G43009 Migraine without aura, not intractable, without status migrainosus: Secondary | ICD-10-CM

## 2020-10-28 DIAGNOSIS — Z0000 Encounter for general adult medical examination without abnormal findings: Secondary | ICD-10-CM | POA: Insufficient documentation

## 2020-10-28 DIAGNOSIS — L309 Dermatitis, unspecified: Secondary | ICD-10-CM | POA: Insufficient documentation

## 2020-10-28 DIAGNOSIS — J329 Chronic sinusitis, unspecified: Secondary | ICD-10-CM | POA: Insufficient documentation

## 2020-10-28 MED ORDER — AZITHROMYCIN 250 MG PO TABS
ORAL_TABLET | ORAL | 0 refills | Status: DC
Start: 2020-10-28 — End: 2020-11-27

## 2020-10-28 MED ORDER — NOREL AD 4-10-325 MG PO TABS: 1.0000 | ORAL_TABLET | ORAL | 1 refills | Status: DC | PRN

## 2020-10-28 MED ORDER — SUMATRIPTAN SUCCINATE 50 MG PO TABS: 50.0000 mg | ORAL_TABLET | ORAL | 2 refills | Status: DC | PRN

## 2020-10-28 MED ORDER — BETAMETHASONE VALERATE 0.1 % EX CREA: TOPICAL_CREAM | CUTANEOUS | 1 refills | Status: DC | PRN

## 2020-10-28 NOTE — Patient Instructions (Signed)
Please have fasting labs drawn 2-3 days prior to your appointment so we can discuss the results during your office visit.  The Norel that I am sending for sinus issues has an antihistamine in it, so she does not have to take zyrtec while using this medicine.

## 2020-10-28 NOTE — Assessment & Plan Note (Signed)
-  refilled sumatriptan -she states she may have 1 migraine per month

## 2020-10-28 NOTE — Assessment & Plan Note (Signed)
-  Rx. norel -Rx. azithromycin

## 2020-10-28 NOTE — Assessment & Plan Note (Addendum)
-  will screen for HCV prior to next appointment -referred to GYN for PAP (prefers female for PAP and has hx of ASCUS) -Dx codes wouldn't cover HIV screening

## 2020-10-28 NOTE — Assessment & Plan Note (Signed)
-  refilled betamethasone cream -usually affects elbows and ears

## 2020-10-28 NOTE — Assessment & Plan Note (Signed)
-  her mother states that she did not have follow-up testing -referred to [facility] for PAP

## 2020-10-28 NOTE — Assessment & Plan Note (Signed)
-  obtain records form Dr. Sudie Bailey

## 2020-10-28 NOTE — Progress Notes (Signed)
New Patient Office Visit  Subjective:  Patient ID: Joanna Fisher, female    DOB: October 24, 1990  Age: 30 y.o. MRN: [id number]  CC:  Chief Complaint  Patient presents with  . New Patient (Initial Visit)    HPI Joanna Fisher presents for new patient visit. Transferring care from Dr. Karie Kirks Last physical was in 2019. Last labs 12/16/17.  She has hx of PAP with atypical squamous cells. She was born with cerebral palsy.  She also has hx of autism.  She has been having a cough and head cold for 10 days. She has been taking Vapo-Ice.  Past Medical History:  Diagnosis Date  . Allergic rhinitis   . ASCUS of cervix with negative high risk HPV 04/08/2017   Repeat in 1 year   . Autistic disorder   . CP (cerebral palsy), hypotonic ([place])   . Eczema   . Hypotonia   . Migraine 09/18/2015  . MRSA (methicillin resistant Staphylococcus aureus) 07/17/2014    Past Surgical History:  Procedure Laterality Date  . NONE TO DATE     As of 12/14/17    Family History  Problem Relation Age of Onset  . Heart disease Other   . Arthritis Other   . Cancer Other   . Asthma Other   . Diabetes Other   . Dementia Paternal Grandfather   . Heart failure Paternal Grandfather   . Diabetes Paternal Grandmother   . Hypertension Paternal Grandmother   . Fibromyalgia Paternal Grandmother   . COPD Paternal Grandmother   . Heart disease Maternal Grandmother   . Heart disease Maternal Grandfather   . Liver cancer Maternal Grandfather        liver  . Hypertension Father   . Hypertension Mother   . Heart attack Mother   . Other Mother        Hemachromatosis carrier  . Hypertension Brother   . Hyperlipidemia Brother     Social History   Socioeconomic History  . Marital status: Single    Spouse name: Not on file  . Number of children: Not on file  . Years of education: 12th grade  . Highest education level: Not on file  Occupational History  . Occupation: na    Employer: not employed   Tobacco Use  . Smoking status: Never Smoker  . Smokeless tobacco: Never Used  Substance and Sexual Activity  . Alcohol use: No  . Drug use: No  . Sexual activity: Never  Other Topics Concern  . Not on file  Social History Narrative  . Not on file   Social Determinants of Health   Financial Resource Strain: Not on file  Food Insecurity: Not on file  Transportation Needs: Not on file  Physical Activity: Not on file  Stress: Not on file  Social Connections: Not on file  Intimate Partner Violence: Not on file    ROS Review of Systems  Constitutional: Negative for chills and fever.  HENT: Positive for congestion, postnasal drip and rhinorrhea.   Respiratory: Positive for cough. Negative for chest tightness, shortness of breath and wheezing.   Cardiovascular: Negative.     Objective:   Today's Vitals: BP 98/63   Pulse 94   Temp 98.5 F (36.9 C)   Resp 20   Ht $R'5\' 2"'qf$  (1.575 m)   Wt 113 lb (51.3 kg)   SpO2 94%   BMI 20.67 kg/m   Physical Exam  Assessment & Plan:   Problem List Items Addressed This  Visit      Cardiovascular and Mediastinum   Migraine    -refilled sumatriptan -she states she may have 1 migraine per month      Relevant Medications   SUMAtriptan (IMITREX) 50 MG tablet   Other Relevant Orders   CBC with Differential/Platelet   CMP14+EGFR     Respiratory   Sinusitis    -Rx. norel -Rx. azithromycin      Relevant Medications   azithromycin (ZITHROMAX) 250 MG tablet   Chlorphen-PE-Acetaminophen (NOREL AD) 4-10-325 MG TABS   Other Relevant Orders   CBC with Differential/Platelet   CMP14+EGFR     Musculoskeletal and Integument   Eczema    -refilled betamethasone cream -usually affects elbows and ears      Relevant Orders   CBC with Differential/Platelet   CMP14+EGFR     Other   ASCUS of cervix with negative high risk HPV    -her mother states that she did not have follow-up testing -referred to [facility] for PAP      Relevant  Orders   Ambulatory referral to Obstetrics / Gynecology   Encounter to establish care - Primary    -obtain records form Dr. Karie Kirks      Relevant Orders   CBC with Differential/Platelet   CMP14+EGFR   Lipid Panel With LDL/HDL Ratio   HCV Ab w/Rflx to Verification   Screening due    -will screen for HCV prior to next appointment -referred to GYN for PAP (prefers female for PAP and has hx of ASCUS) -Dx codes wouldn't cover HIV screening      Relevant Orders   HCV Ab w/Rflx to Verification      Outpatient Encounter Medications as of 10/28/2020  Medication Sig  . azithromycin (ZITHROMAX) 250 MG tablet Take as directed  . CALCIUM-VITAMIN D PO Take by mouth.  . cetirizine (ZYRTEC) 10 MG tablet Take 10 mg by mouth daily.  . Chlorphen-PE-Acetaminophen (NOREL AD) 4-10-325 MG TABS Take 1 tablet by mouth every 4 (four) hours as needed (nasal congestion, cold symptoms).  . Multiple Vitamin (MULTIVITAMIN) tablet Take 1 tablet by mouth daily.  . [DISCONTINUED] betamethasone valerate (VALISONE) 0.1 % cream Apply topically as needed.   . [DISCONTINUED] SUMAtriptan (IMITREX) 50 MG tablet Take 50 mg by mouth every 2 (two) hours as needed for migraine. May repeat in 2 hours if headache persists or recurs.  . betamethasone valerate (VALISONE) 0.1 % cream Apply topically as needed.  . SUMAtriptan (IMITREX) 50 MG tablet Take 1 tablet (50 mg total) by mouth every 2 (two) hours as needed for migraine. May repeat in 2 hours if headache persists or recurs.   No facility-administered encounter medications on file as of 10/28/2020.    Follow-up: Return in about 1 week (around 11/04/2020) for Physical Exam.   Noreene Larsson, NP

## 2020-11-08 LAB — CMP14+EGFR
ALT: 26 IU/L (ref 0–32)
AST: 24 IU/L (ref 0–40)
Albumin/Globulin Ratio: 2 (ref 1.2–2.2)
Albumin: 4.5 g/dL (ref 3.9–5.0)
Alkaline Phosphatase: 45 IU/L (ref 44–121)
BUN/Creatinine Ratio: 16 (ref 9–23)
BUN: 11 mg/dL (ref 6–20)
Bilirubin Total: 0.8 mg/dL (ref 0.0–1.2)
CO2: 25 mmol/L (ref 20–29)
Calcium: 9.6 mg/dL (ref 8.7–10.2)
Chloride: 101 mmol/L (ref 96–106)
Creatinine, Ser: 0.68 mg/dL (ref 0.57–1.00)
Globulin, Total: 2.2 g/dL (ref 1.5–4.5)
Glucose: 81 mg/dL (ref 65–99)
Potassium: 4.3 mmol/L (ref 3.5–5.2)
Sodium: 140 mmol/L (ref 134–144)
Total Protein: 6.7 g/dL (ref 6.0–8.5)
eGFR: 121 mL/min/{1.73_m2} (ref >59)

## 2020-11-08 LAB — CBC WITH DIFFERENTIAL/PLATELET
Basophils Absolute: 0 10*3/uL (ref 0.0–0.2)
Basos: 1 %
EOS (ABSOLUTE): 0.1 10*3/uL (ref 0.0–0.4)
Eos: 3 %
Hematocrit: 40.3 % (ref 34.0–46.6)
Hemoglobin: 13.5 g/dL (ref 11.1–15.9)
Immature Grans (Abs): 0 10*3/uL (ref 0.0–0.1)
Immature Granulocytes: 0 %
Lymphocytes Absolute: 1.5 10*3/uL (ref 0.7–3.1)
Lymphs: 38 %
MCH: 32.4 pg (ref 26.6–33.0)
MCHC: 33.5 g/dL (ref 31.5–35.7)
MCV: 97 fL (ref 79–97)
Monocytes Absolute: 0.4 10*3/uL (ref 0.1–0.9)
Monocytes: 9 %
Neutrophils Absolute: 2 10*3/uL (ref 1.4–7.0)
Neutrophils: 49 %
Platelets: 348 10*3/uL (ref 150–450)
RBC: 4.17 x10E6/uL (ref 3.77–5.28)
RDW: 11.8 % (ref 11.7–15.4)
WBC: 4.1 10*3/uL (ref 3.4–10.8)

## 2020-11-08 LAB — LIPID PANEL WITH LDL/HDL RATIO
Cholesterol, Total: 135 mg/dL (ref 100–199)
HDL: 38 mg/dL — ABNORMAL LOW (ref >39)
LDL Chol Calc (NIH): 80 mg/dL (ref 0–99)
LDL/HDL Ratio: 2.1 ratio (ref 0.0–3.2)
Triglycerides: 90 mg/dL (ref 0–149)
VLDL Cholesterol Cal: 17 mg/dL (ref 5–40)

## 2020-11-08 LAB — HCV AB W/RFLX TO VERIFICATION: HCV Ab: <0.1 s/co ratio (ref 0.0–0.9)

## 2020-11-08 LAB — HCV INTERPRETATION

## 2020-11-08 NOTE — Progress Notes (Signed)
Labs look great. We can discuss them at her appointment on the 19th.

## 2020-11-12 ENCOUNTER — Ambulatory Visit: Payer: Medicare Other | Admitting: Nurse Practitioner

## 2020-11-27 ENCOUNTER — Other Ambulatory Visit: Payer: Self-pay

## 2020-11-27 ENCOUNTER — Ambulatory Visit (INDEPENDENT_AMBULATORY_CARE_PROVIDER_SITE_OTHER): Payer: Medicare Other | Admitting: Nurse Practitioner

## 2020-11-27 ENCOUNTER — Encounter: Payer: Self-pay | Admitting: Nurse Practitioner

## 2020-11-27 VITALS — BP 106/72 | HR 93 | Temp 97.9°F | Resp 18 | Ht 62.0 in | Wt 112.0 lb

## 2020-11-27 DIAGNOSIS — J302 Other seasonal allergic rhinitis: Secondary | ICD-10-CM

## 2020-11-27 DIAGNOSIS — Z139 Encounter for screening, unspecified: Secondary | ICD-10-CM | POA: Diagnosis not present

## 2020-11-27 DIAGNOSIS — H6122 Impacted cerumen, left ear: Secondary | ICD-10-CM

## 2020-11-27 DIAGNOSIS — Z113 Encounter for screening for infections with a predominantly sexual mode of transmission: Secondary | ICD-10-CM

## 2020-11-27 DIAGNOSIS — R87610 Atypical squamous cells of undetermined significance on cytologic smear of cervix (ASC-US): Secondary | ICD-10-CM

## 2020-11-27 DIAGNOSIS — Z0000 Encounter for general adult medical examination without abnormal findings: Secondary | ICD-10-CM

## 2020-11-27 MED ORDER — DEBROX 6.5 % OT SOLN: 5.0000 [drp] | Freq: Two times a day (BID) | OTIC | 0 refills | Status: DC

## 2020-11-27 NOTE — Progress Notes (Signed)
Established Patient Office Visit  Subjective:  Patient ID: [name], female    DOB: [birth year]  Age: 30 y.o. MRN: [id number]  CC:  Chief Complaint  Patient presents with  . Annual Exam    HPI Joanna Fisher presents for physical exam.  At last OV, she was treated for allergies/sinus infection with z-pack and norel.  We set her up with [facility] for PAP, and refilled her betamethasone cream for eczema.  Past Medical History:  Diagnosis Date  . Allergic rhinitis   . ASCUS of cervix with negative high risk HPV 04/08/2017   Repeat in 1 year   . Autistic disorder   . CP (cerebral palsy), hypotonic ([place])   . Eczema   . Hypotonia   . Migraine 09/18/2015  . MRSA (methicillin resistant Staphylococcus aureus) 07/17/2014    Past Surgical History:  Procedure Laterality Date  . NONE TO DATE     As of 12/14/17    Family History  Problem Relation Age of Onset  . Heart disease Other   . Arthritis Other   . Cancer Other   . Asthma Other   . Diabetes Other   . Dementia Paternal Grandfather   . Heart failure Paternal Grandfather   . Diabetes Paternal Grandmother   . Hypertension Paternal Grandmother   . Fibromyalgia Paternal Grandmother   . COPD Paternal Grandmother   . Heart disease Maternal Grandmother   . Heart disease Maternal Grandfather   . Liver cancer Maternal Grandfather        liver  . Hypertension Father   . Hypertension Mother   . Heart attack Mother   . Other Mother        Hemachromatosis carrier  . Hypertension Brother   . Hyperlipidemia Brother     Social History   Socioeconomic History  . Marital status: Single    Spouse name: Not on file  . Number of children: Not on file  . Years of education: 12th grade  . Highest education level: Not on file  Occupational History  . Occupation: na    Employer: not employed  Tobacco Use  . Smoking status: Never Smoker  . Smokeless tobacco: Never Used  Substance and Sexual Activity  . Alcohol  use: No  . Drug use: No  . Sexual activity: Never  Other Topics Concern  . Not on file  Social History Narrative  . Not on file   Social Determinants of Health   Financial Resource Strain: Not on file  Food Insecurity: Not on file  Transportation Needs: Not on file  Physical Activity: Not on file  Stress: Not on file  Social Connections: Not on file  Intimate Partner Violence: Not on file    Outpatient Medications Prior to Visit  Medication Sig Dispense Refill  . betamethasone valerate (VALISONE) 0.1 % cream Apply topically as needed. 30 g 1  . CALCIUM-VITAMIN D PO Take by mouth.    . cetirizine (ZYRTEC) 10 MG tablet Take 10 mg by mouth daily.    . Multiple Vitamin (MULTIVITAMIN) tablet Take 1 tablet by mouth daily.    . SUMAtriptan (IMITREX) 50 MG tablet Take 1 tablet (50 mg total) by mouth every 2 (two) hours as needed for migraine. May repeat in 2 hours if headache persists or recurs. 10 tablet 2  . azithromycin (ZITHROMAX) 250 MG tablet Take as directed 6 tablet 0  . Chlorphen-PE-Acetaminophen (NOREL AD) 4-10-325 MG TABS Take 1 tablet by mouth every 4 (four)  hours as needed (nasal congestion, cold symptoms). 20 tablet 1   No facility-administered medications prior to visit.    No Known Allergies  ROS Review of Systems  Constitutional: Negative.   HENT: Negative.   Eyes: Negative.   Respiratory: Negative.   Cardiovascular: Negative.   Gastrointestinal: Negative.   Endocrine: Negative.   Genitourinary: Negative.   Musculoskeletal: Negative.   Skin: Negative.   Allergic/Immunologic: Negative.   Neurological: Negative.   Hematological: Negative.   Psychiatric/Behavioral: Negative.        Autism spectrum disorder; cerebral palsy      Objective:    Physical Exam Constitutional:      Appearance: Normal appearance.  HENT:     Head: Normocephalic and atraumatic.     Right Ear: Tympanic membrane, ear canal and external ear normal.     Left Ear: External ear  normal. There is impacted cerumen.     Nose: Nose normal.     Mouth/Throat:     Mouth: Mucous membranes are moist.     Pharynx: Oropharynx is clear.  Eyes:     Extraocular Movements: Extraocular movements intact.     Conjunctiva/sclera: Conjunctivae normal.     Pupils: Pupils are equal, round, and reactive to light.  Cardiovascular:     Rate and Rhythm: Normal rate and regular rhythm.     Pulses: Normal pulses.     Heart sounds: Normal heart sounds.  Pulmonary:     Effort: Pulmonary effort is normal.     Breath sounds: Normal breath sounds.  Abdominal:     General: Abdomen is flat. Bowel sounds are normal.     Palpations: Abdomen is soft.  Musculoskeletal:        General: Normal range of motion.     Cervical back: Normal range of motion and neck supple.  Skin:    General: Skin is warm and dry.     Capillary Refill: Capillary refill takes less than 2 seconds.  Neurological:     Mental Status: She is alert and oriented to person, place, and time. Mental status is at baseline.     Cranial Nerves: No cranial nerve deficit.     Sensory: No sensory deficit.     Motor: Weakness present.     Comments: 4/5 strength globally  Psychiatric:        Mood and Affect: Mood normal.        Behavior: Behavior normal.     BP 106/72   Pulse 93   Temp 97.9 F (36.6 C)   Resp 18   Ht _0  (1.575 m)   Wt 112 lb (50.8 kg)   SpO2 97%   BMI 20.49 kg/m  Wt Readings from Last 3 Encounters:  11/27/20 112 lb (50.8 kg)  10/28/20 113 lb (51.3 kg)  03/16/18 116 lb 3.2 oz (52.7 kg)     Health Maintenance Due  Topic Date Due  . HIV Screening  Never done  . TETANUS/TDAP  Never done  . PAP SMEAR-Modifier  04/02/2020    There are no preventive care reminders to display for this patient.  No results found for: TSH Lab Results  Component Value Date   WBC 4.1 11/07/2020   HGB 13.5 11/07/2020   HCT 40.3 11/07/2020   MCV 97 11/07/2020   PLT 348 11/07/2020   Lab Results  Component Value  Date   NA 140 11/07/2020   K 4.3 11/07/2020   CO2 25 11/07/2020   GLUCOSE 81 11/07/2020  BUN 11 11/07/2020   CREATININE 0.68 11/07/2020   BILITOT 0.8 11/07/2020   ALKPHOS 45 11/07/2020   AST 24 11/07/2020   ALT 26 11/07/2020   PROT 6.7 11/07/2020   ALBUMIN 4.5 11/07/2020   CALCIUM 9.6 11/07/2020   EGFR 121 11/07/2020   Lab Results  Component Value Date   CHOL 135 11/07/2020   Lab Results  Component Value Date   HDL 38 (L) 11/07/2020   Lab Results  Component Value Date   LDLCALC 80 11/07/2020   Lab Results  Component Value Date   TRIG 90 11/07/2020   No results found for: CHOLHDL No results found for: HGBA1C    Assessment & Plan:   Problem List Items Addressed This Visit      Nervous and Auditory   Impacted cerumen of left ear    -Rx. Debrox for left ear -discussed that we may need irrigation to remove was, but pt has autism and doesn't like people touching or inserting things in her ears -if hearing loss or ear pain develops we will irrigate or consider ENT referral        Other   ASCUS of cervix with negative high risk HPV    -has upcoming appointment with [facility] for PAP      Relevant Orders   CBC with Differential/Platelet   CMP14+EGFR   Routine medical exam    -physical exam performed today      Relevant Orders   CBC with Differential/Platelet   CMP14+EGFR   Lipid Panel With LDL/HDL Ratio   Screening due - Primary   Seasonal allergies    -has been using zyrtec 7+ years and allergies are getting worse -we discussed using allegra or claritin for a few months and then cycling back to zyrtec      Relevant Orders   CBC with Differential/Platelet   CMP14+EGFR    Other Visit Diagnoses    Screen for STD (sexually transmitted disease)          Meds ordered this encounter  Medications  . carbamide peroxide (DEBROX) 6.5 % OTIC solution    Sig: Place 5 drops into the left ear 2 (two) times daily. For a week    Dispense:  15 mL     Refill:  0    Follow-up: Return in about 1 year (around 11/27/2021) for Physical Exam.    Noreene Larsson, NP

## 2020-11-27 NOTE — Assessment & Plan Note (Signed)
-  Rx. Debrox for left ear -discussed that we may need irrigation to remove was, but pt has autism and doesn't like people touching or inserting things in her ears -if hearing loss or ear pain develops we will irrigate or consider ENT referral

## 2020-11-27 NOTE — Assessment & Plan Note (Signed)
-  has upcoming appointment with [facility] for PAP

## 2020-11-27 NOTE — Assessment & Plan Note (Signed)
-  physical exam performed today 

## 2020-11-27 NOTE — Assessment & Plan Note (Signed)
-  has been using zyrtec 7+ years and allergies are getting worse -we discussed using allegra or claritin for a few months and then cycling back to zyrtec

## 2020-11-27 NOTE — Patient Instructions (Signed)
Please have fasting labs drawn 2-3 days prior to your appointment so we can discuss the results during your office visit.  For ear wax, I sent in debrox drops. Leave them in the left ear for about 5 minutes to help dissolve the wax. If hearing issues or ear fullness develop, we will need to irrigate her ears.

## 2020-12-16 ENCOUNTER — Other Ambulatory Visit: Payer: Medicare Other | Admitting: Obstetrics & Gynecology

## 2021-01-16 ENCOUNTER — Other Ambulatory Visit: Payer: Medicare Other | Admitting: Obstetrics & Gynecology

## 2021-02-20 ENCOUNTER — Other Ambulatory Visit: Payer: Self-pay

## 2021-02-20 ENCOUNTER — Ambulatory Visit (INDEPENDENT_AMBULATORY_CARE_PROVIDER_SITE_OTHER): Payer: Medicare Other | Admitting: Obstetrics & Gynecology

## 2021-02-20 ENCOUNTER — Encounter: Payer: Self-pay | Admitting: Obstetrics & Gynecology

## 2021-02-20 ENCOUNTER — Other Ambulatory Visit (HOSPITAL_COMMUNITY)
Admission: RE | Admit: 2021-02-20 | Discharge: 2021-02-20 | Disposition: A | Discharge status: Home / Self Care | Payer: Medicare Other | Source: Ambulatory Visit | Attending: Obstetrics & Gynecology | Admitting: Obstetrics & Gynecology

## 2021-02-20 VITALS — BP 106/70 | HR 87 | Ht 62.0 in | Wt 105.8 lb

## 2021-02-20 DIAGNOSIS — Z01419 Encounter for gynecological examination (general) (routine) without abnormal findings: Secondary | ICD-10-CM

## 2021-02-20 DIAGNOSIS — Z124 Encounter for screening for malignant neoplasm of cervix: Secondary | ICD-10-CM | POA: Insufficient documentation

## 2021-02-20 DIAGNOSIS — Z1151 Encounter for screening for human papillomavirus (HPV): Secondary | ICD-10-CM | POA: Insufficient documentation

## 2021-02-20 NOTE — Progress Notes (Signed)
   WELL-WOMAN EXAMINATION Patient name: Joanna Fisher MRN [id number]  Date of birth: 12-17-1990 Chief Complaint:   Annual Exam  History of Present Illness:   Joanna Fisher is a 30 y.o. G0P0000 female being seen today for a routine well-woman exam.  Today she notes: no acute complaints or concerns.  She presents with her mother who contributes to her history.   Patient's last menstrual period was 02/07/2021. Denies issues with her menses.  Denies irregular discharge, itching or irritation.   The current method of family planning is none- not sexually active   Last pap 2018.  Last mammogram: n/a. Last colonoscopy: n/a  Depression screen [facility] 2/9 02/20/2021 11/27/2020 10/28/2020 04/02/2017  Decreased Interest 0 0 0 0  Down, Depressed, Hopeless 0 0 0 0  PHQ - 2 Score 0 0 0 0  Altered sleeping 0 - - -  Tired, decreased energy 0 - - -  Change in appetite 0 - - -  Feeling bad or failure about yourself  0 - - -  Trouble concentrating 0 - - -  Moving slowly or fidgety/restless 0 - - -  Suicidal thoughts 0 - - -  PHQ-9 Score 0 - - -      Review of Systems:   Pertinent items are noted in HPI Denies any headaches, blurred vision, fatigue, shortness of breath, chest pain, abdominal pain, bowel movements, urination, or intercourse unless otherwise stated above.  Pertinent History Reviewed:  Reviewed past medical,surgical, social and family history.  Reviewed problem list, medications and allergies. Physical Assessment:   Vitals:   02/20/21 0935  BP: 106/70  Pulse: 87  Weight: 105 lb 12.8 oz (48 kg)  Height: 5\' 2"  (1.575 m)  Body mass index is 19.35 kg/m.        Physical Examination:   General appearance - well appearing, and in no distress  Mental status - alert, oriented to person, place, and time  Psych:  She has a normal mood and affect  Skin - warm and dry, normal color, no suspicious lesions noted  Chest - effort normal, all lung fields clear to auscultation  bilaterally  Heart - normal rate and regular rhythm  Neck:  midline trachea, no thyromegaly or nodules  Breasts - breasts appear normal, no suspicious masses, no skin or nipple changes or  axillary nodes  Abdomen - soft, nontender, nondistended, no masses or organomegaly  Pelvic - VULVA: normal appearing vulva with no masses, tenderness or lesions  VAGINA: normal appearing vagina with normal color and discharge, no lesions  CERVIX: normal appearing cervix without discharge or lesions, no CMT  Thin prep pap is done with HR HPV cotesting  UTERUS: uterus is felt to be normal size, shape, consistency and nontender   ADNEXA: No adnexal masses or tenderness noted.  Extremities:  No swelling or varicosities noted  Chaperone: Latisha Cresenzo     Assessment & Plan:  1) Well-Woman Exam -pap collected, reviewed guidelines   No orders of the defined types were placed in this encounter.   Meds: No orders of the defined types were placed in this encounter.   Follow-up: Return in about 1 year (around 02/20/2022) for Annual.   02/22/2022, DO Attending Obstetrician & Gynecologist, [facility] for [facility], [facility]

## 2021-02-24 ENCOUNTER — Telehealth: Payer: Self-pay

## 2021-02-24 ENCOUNTER — Telehealth: Payer: Self-pay | Admitting: Obstetrics & Gynecology

## 2021-02-24 LAB — CYTOLOGY - PAP
Comment: NEGATIVE
Diagnosis: NEGATIVE
High risk HPV: NEGATIVE

## 2021-02-24 NOTE — Telephone Encounter (Signed)
Called pt to inform her of test results. No answer, left vm 

## 2021-02-24 NOTE — Telephone Encounter (Signed)
Returned pt's mother's Harriett Sine) call. Pt is autistic and parents are on DPR. Relayed pt's pap smear results. Confirmed understanding.

## 2021-02-24 NOTE — Telephone Encounter (Signed)
Pt's mom returning call to [facility] See previous phone message  - please call [phone]

## 2021-04-02 ENCOUNTER — Telehealth: Payer: Self-pay | Admitting: Nurse Practitioner

## 2021-04-02 NOTE — Telephone Encounter (Signed)
  Left message for patient to call back and schedule Medicare Annual Wellness Visit (AWV) in office.   If unable to come into the office for AWV,  please offer to do virtually or by telephone.  No hx of AWV eligible for AWVI as of 11/25/2019  Please schedule at anytime with [facility]-Nurse Health Advisor.      40 Minutes appointment   Any questions, please call me at [phone]

## 2021-04-24 ENCOUNTER — Ambulatory Visit (INDEPENDENT_AMBULATORY_CARE_PROVIDER_SITE_OTHER): Payer: Medicare Other

## 2021-04-24 ENCOUNTER — Other Ambulatory Visit: Payer: Self-pay

## 2021-04-24 DIAGNOSIS — Z0000 Encounter for general adult medical examination without abnormal findings: Secondary | ICD-10-CM

## 2021-04-24 NOTE — Progress Notes (Signed)
Subjective:   Joanna Fisher is a 30 y.o. female who presents for an Initial Medicare Annual Wellness Visit. I connected with  Joanna Fisher on 04/24/21 by a audio enabled telemedicine application and verified that I am speaking with the correct person using two identifiers.  Patient Location: Home  Provider Location: Home Office Harriett Sine (mother) assisted [name] with visit today I discussed the limitations of evaluation and management by telemedicine. The patient expressed understanding and agreed to proceed.   Review of Systems    Defer to PCP       Objective:    There were no vitals filed for this visit. There is no height or weight on file to calculate BMI.  Advanced Directives 04/24/2021 04/02/2017  Does Patient Have a Medical Advance Directive? No No  Would patient like information on creating a medical advance directive? - Yes (MAU/Ambulatory/Procedural Areas - Information given)    Current Medications (verified) Outpatient Encounter Medications as of 04/24/2021  Medication Sig   betamethasone valerate (VALISONE) 0.1 % cream Apply topically as needed.   CALCIUM-VITAMIN D PO Take by mouth.   Multiple Vitamin (MULTIVITAMIN) tablet Take 1 tablet by mouth daily.   SUMAtriptan (IMITREX) 50 MG tablet Take 1 tablet (50 mg total) by mouth every 2 (two) hours as needed for migraine. May repeat in 2 hours if headache persists or recurs.   carbamide peroxide (DEBROX) 6.5 % OTIC solution Place 5 drops into the left ear 2 (two) times daily. For a week (Patient not taking: No sig reported)   cetirizine (ZYRTEC) 10 MG tablet Take 10 mg by mouth daily. (Patient not taking: Reported on 04/24/2021)   No facility-administered encounter medications on file as of 04/24/2021.    Allergies (verified) Patient has no known allergies.   History: Past Medical History:  Diagnosis Date   Allergic rhinitis    ASCUS of cervix with negative high risk HPV 04/08/2017   Repeat in 1 year     Autistic disorder    CP (cerebral palsy), hypotonic (HCC)    Eczema    Hypotonia    Migraine 09/18/2015   MRSA (methicillin resistant Staphylococcus aureus) 07/17/2014   Past Surgical History:  Procedure Laterality Date   NONE TO DATE     As of 12/14/17   Family History  Problem Relation Age of Onset   Heart disease Other    Arthritis Other    Cancer Other    Asthma Other    Diabetes Other    Dementia Paternal Grandfather    Heart failure Paternal Grandfather    Diabetes Paternal Grandmother    Hypertension Paternal Grandmother    Fibromyalgia Paternal Grandmother    COPD Paternal Grandmother    Heart disease Maternal Grandmother    Heart disease Maternal Grandfather    Liver cancer Maternal Grandfather        liver   Hypertension Father    Hypertension Mother    Heart attack Mother    Other Mother        Hemachromatosis carrier   Hypertension Brother    Hyperlipidemia Brother    Social History   Socioeconomic History   Marital status: Single    Spouse name: Not on file   Number of children: Not on file   Years of education: 12th grade   Highest education level: Not on file  Occupational History   Occupation: na    Employer: not employed  Tobacco Use   Smoking status: Never   Smokeless  tobacco: Never  Substance and Sexual Activity   Alcohol use: No   Drug use: No   Sexual activity: Never  Other Topics Concern   Not on file  Social History Narrative   Not on file   Social Determinants of Health   Financial Resource Strain: Low Risk    Difficulty of Paying Living Expenses: Not hard at all  Food Insecurity: No Food Insecurity   Worried About Programme researcher, broadcasting/film/video in the Last Year: Never true   Ran Out of Food in the Last Year: Never true  Transportation Needs: No Transportation Needs   Lack of Transportation (Medical): No   Lack of Transportation (Non-Medical): No  Physical Activity: Inactive   Days of Exercise per Week: 0 days   Minutes of Exercise  per Session: 0 min  Stress: No Stress Concern Present   Feeling of Stress : Not at all  Social Connections: Socially Isolated   Frequency of Communication with Friends and Family: Never   Frequency of Social Gatherings with Friends and Family: Never   Attends Religious Services: More than 4 times per year   Active Member of [employer] or Organizations: No   Attends Engineer, structural: Never   Marital Status: Never married    Tobacco Counseling Counseling given: Not Answered   Clinical Intake:  Pre-visit preparation completed: Yes  Pain : No/denies pain     Diabetes: No  How often do you need to have someone help you when you read instructions, pamphlets, or other written materials from your doctor or pharmacy?: 5 - Always What is the last grade level you completed in school?: 12TH GRADE  Diabetic?NO  Interpreter Needed?: No  Information entered by :: Zigmond Trela J,CMA   Activities of Daily Living In your present state of health, do you have any difficulty performing the following activities: 04/24/2021  Hearing? N  Vision? N  Difficulty concentrating or making decisions? Y  Walking or climbing stairs? N  Dressing or bathing? N  Doing errands, shopping? Y  Preparing Food and eating ? Y  Using the Toilet? N  In the past six months, have you accidently leaked urine? N  Do you have problems with loss of bowel control? N  Managing your Medications? N  Managing your Finances? Y  Housekeeping or managing your Housekeeping? Y  Some recent data might be hidden    Patient Care Team: Heather Roberts, NP as PCP - General (Nurse Practitioner) [place] Bali, MD (Inactive) as Consulting Physician (Gastroenterology)  Indicate any recent Medical Services you may have received from other than [facility] providers in the past year (date may be approximate).     Assessment:   This is a routine wellness examination for [name].  Hearing/Vision screen No results  found.  Dietary issues and exercise activities discussed: Current Exercise Habits: The patient does not participate in regular exercise at present   Goals Addressed   None    Depression Screen PHQ 2/9 Scores 04/24/2021 02/20/2021 11/27/2020 10/28/2020 04/02/2017  PHQ - 2 Score 0 0 0 0 0  PHQ- 9 Score - 0 - - -    Fall Risk Fall Risk  04/24/2021 02/20/2021 11/27/2020 10/28/2020  Falls in the past year? 0 0 0 0  Number falls in past yr: 0 0 0 0  Injury with Fall? 0 0 0 0  Risk for fall due to : No Fall Risks - No Fall Risks No Fall Risks  Follow up Falls evaluation completed -  Falls evaluation completed Falls evaluation completed    FALL RISK PREVENTION PERTAINING TO THE HOME:  Any stairs in or around the home? Yes  If so, are there any without handrails? No  Home free of loose throw rugs in walkways, pet beds, electrical cords, etc? Yes  Adequate lighting in your home to reduce risk of falls? Yes   ASSISTIVE DEVICES UTILIZED TO PREVENT FALLS:  Life alert? No  Use of a cane, walker or w/c? No  Grab bars in the bathroom? Yes  Shower chair or bench in shower? No  Elevated toilet seat or a handicapped toilet? No   TIMED UP AND GO:  Was the test performed?  N/A .  Length of time to ambulate 10 feet: N/A sec.     Cognitive Function:     6CIT Screen 04/24/2021  What Year? 0 points  What month? 0 points  What time? 0 points  Count back from 20 0 points  Months in reverse 0 points  Repeat phrase 0 points  Total Score 0    Immunizations  There is no immunization history on file for this patient.  TDAP status: Due, Education has been provided regarding the importance of this vaccine. Advised may receive this vaccine at local pharmacy or Health Dept. Aware to provide a copy of the vaccination record if obtained from local pharmacy or Health Dept. Verbalized acceptance and understanding.  Flu Vaccine status: Due, Education has been provided regarding the importance of this vaccine.  Advised may receive this vaccine at local pharmacy or Health Dept. Aware to provide a copy of the vaccination record if obtained from local pharmacy or Health Dept. Verbalized acceptance and understanding.    Covid-19 vaccine status: Information provided on how to obtain vaccines.   Qualifies for Shingles Vaccine? No   Zostavax completed No   Shingrix Completed?: No.    Education has been provided regarding the importance of this vaccine. Patient has been advised to call insurance company to determine out of pocket expense if they have not yet received this vaccine. Advised may also receive vaccine at local pharmacy or Health Dept. Verbalized acceptance and understanding.  Screening Tests Health Maintenance  Topic Date Due   HIV Screening  Never done   TETANUS/TDAP  Never done   INFLUENZA VACCINE  Never done   COVID-19 Vaccine (1) 12/13/2021 (Originally 05/23/1991)   PAP SMEAR-Modifier  02/21/2024   Hepatitis C Screening  Completed   HPV VACCINES  Aged Out    Health Maintenance  Health Maintenance Due  Topic Date Due   HIV Screening  Never done   TETANUS/TDAP  Never done   INFLUENZA VACCINE  Never done    Lung Cancer Screening: (Low Dose CT Chest recommended if Age 75-80 years, 30 pack-year currently smoking OR have quit w/in 15years.) does not qualify.   Lung Cancer Screening Referral: NO  Additional Screening:  Hepatitis C Screening: does qualify; Completed 11/07/2020  Vision Screening: Recommended annual ophthalmology exams for early detection of glaucoma and other disorders of the eye. Is the patient up to date with their annual eye exam?  No  Who is the provider or what is the name of the office in which the patient attends annual eye exams? N/A If pt is not established with a provider, would they like to be referred to a provider to establish care? No .   Dental Screening: Recommended annual dental exams for proper oral hygiene  Community Resource Referral / Chronic  Care Management:  CRR required this visit?  No   CCM required this visit?  No      Plan:     I have personally reviewed and noted the following in the patient's chart:   Medical and social history Use of alcohol, tobacco or illicit drugs  Current medications and supplements including opioid prescriptions. Patient is not currently taking opioid prescriptions. Functional ability and status Nutritional status Physical activity Advanced directives List of other physicians Hospitalizations, surgeries, and ER visits in previous 12 months Vitals Screenings to include cognitive, depression, and falls Referrals and appointments  In addition, I have reviewed and discussed with patient certain preventive protocols, quality metrics, and best practice recommendations. A written personalized care plan for preventive services as well as general preventive health recommendations were provided to patient.     Victorio Palm, [facility]   04/24/2021   Nurse Notes: Non Face to Face 30 minute visit    Ms. Askren , Thank you for taking time to come for your Medicare Wellness Visit. I appreciate your ongoing commitment to your health goals. Please review the following plan we discussed and let me know if I can assist you in the future.   These are the goals we discussed:  Goals   None     This is a list of the screening recommended for you and due dates:  Health Maintenance  Topic Date Due   HIV Screening  Never done   Tetanus Vaccine  Never done   Flu Shot  Never done   COVID-19 Vaccine (1) 12/13/2021*   Pap Smear  02/21/2024   Hepatitis C Screening: USPSTF Recommendation to screen - Ages 18-79 yo.  Completed   HPV Vaccine  Aged Out  *Topic was postponed. The date shown is not the original due date.

## 2021-04-30 ENCOUNTER — Encounter: Payer: Self-pay | Admitting: Nurse Practitioner

## 2021-04-30 ENCOUNTER — Other Ambulatory Visit: Payer: Self-pay

## 2021-04-30 ENCOUNTER — Ambulatory Visit (INDEPENDENT_AMBULATORY_CARE_PROVIDER_SITE_OTHER): Payer: Medicare Other | Admitting: Nurse Practitioner

## 2021-04-30 ENCOUNTER — Encounter: Payer: Self-pay | Admitting: Internal Medicine

## 2021-04-30 VITALS — BP 111/67 | HR 98 | Ht 62.0 in | Wt 105.0 lb

## 2021-04-30 DIAGNOSIS — R1310 Dysphagia, unspecified: Secondary | ICD-10-CM

## 2021-04-30 DIAGNOSIS — L309 Dermatitis, unspecified: Secondary | ICD-10-CM

## 2021-04-30 NOTE — Assessment & Plan Note (Addendum)
-  referral to GI -lungs clear, no sign of aspiration PNA -may consider SLP consult based on GI findings

## 2021-04-30 NOTE — Progress Notes (Signed)
Acute Office Visit  Subjective:    Patient ID: Joanna Fisher, female    DOB: 04-26-1991, 30 y.o.   MRN: [id number]  Chief Complaint  Patient presents with   Follow-up    Gagging while eating last for several hours after eating rings around eyes    HPI Patient is in today for choking and gagging that usually starts with eating, even liquids like a bowl of soup. This first started several months ago, maybe June. It is gradually getting worse.   She has a rash around her eyes that hs been itching. She has eczema and uses a cream for that, but she doesn't use it near her eyes.  Past Medical History:  Diagnosis Date   Allergic rhinitis    ASCUS of cervix with negative high risk HPV 04/08/2017   Repeat in 1 year    Autistic disorder    CP (cerebral palsy), hypotonic ([place])    Eczema    Hypotonia    Migraine 09/18/2015   MRSA (methicillin resistant Staphylococcus aureus) 07/17/2014    Past Surgical History:  Procedure Laterality Date   NONE TO DATE     As of 12/14/17    Family History  Problem Relation Age of Onset   Heart disease Other    Arthritis Other    Cancer Other    Asthma Other    Diabetes Other    Dementia Paternal Grandfather    Heart failure Paternal Grandfather    Diabetes Paternal Grandmother    Hypertension Paternal Grandmother    Fibromyalgia Paternal Grandmother    COPD Paternal Grandmother    Heart disease Maternal Grandmother    Heart disease Maternal Grandfather    Liver cancer Maternal Grandfather        liver   Hypertension Father    Hypertension Mother    Heart attack Mother    Other Mother        Hemachromatosis carrier   Hypertension Brother    Hyperlipidemia Brother     Social History   Socioeconomic History   Marital status: Single    Spouse name: Not on file   Number of children: Not on file   Years of education: 12th grade   Highest education level: Not on file  Occupational History   Occupation: na    Fish farm manager: not  employed  Tobacco Use   Smoking status: Never   Smokeless tobacco: Never  Substance and Sexual Activity   Alcohol use: No   Drug use: No   Sexual activity: Never  Other Topics Concern   Not on file  Social History Narrative   Not on file   Social Determinants of Health   Financial Resource Strain: Low Risk    Difficulty of Paying Living Expenses: Not hard at all  Food Insecurity: No Food Insecurity   Worried About Charity fundraiser in the Last Year: Never true   [place] in the Last Year: Never true  Transportation Needs: No Transportation Needs   Lack of Transportation (Medical): No   Lack of Transportation (Non-Medical): No  Physical Activity: Inactive   Days of Exercise per Week: 0 days   Minutes of Exercise per Session: 0 min  Stress: No Stress Concern Present   Feeling of Stress : Not at all  Social Connections: Socially Isolated   Frequency of Communication with Friends and Family: Never   Frequency of Social Gatherings with Friends and Family: Never   Attends Religious Services: More  than 4 times per year   Active Member of Clubs or Organizations: No   Attends Archivist Meetings: Never   Marital Status: Never married  Human resources officer Violence: Not At Risk   Fear of Current or Ex-Partner: No   Emotionally Abused: No   Physically Abused: No   Sexually Abused: No    Outpatient Medications Prior to Visit  Medication Sig Dispense Refill   betamethasone valerate (VALISONE) 0.1 % cream Apply topically as needed. 30 g 1   CALCIUM-VITAMIN D PO Take by mouth.     Multiple Vitamin (MULTIVITAMIN) tablet Take 1 tablet by mouth daily.     SUMAtriptan (IMITREX) 50 MG tablet Take 1 tablet (50 mg total) by mouth every 2 (two) hours as needed for migraine. May repeat in 2 hours if headache persists or recurs. 10 tablet 2   carbamide peroxide (DEBROX) 6.5 % OTIC solution Place 5 drops into the left ear 2 (two) times daily. For a week (Patient not taking: No  sig reported) 15 mL 0   cetirizine (ZYRTEC) 10 MG tablet Take 10 mg by mouth daily. (Patient not taking: No sig reported)     No facility-administered medications prior to visit.    No Known Allergies  Review of Systems  Constitutional: Negative.   Respiratory:  Positive for choking.   Cardiovascular: Negative.   Gastrointestinal:        Trouble swallowing      Objective:    Physical Exam Constitutional:      Appearance: Normal appearance.  Cardiovascular:     Rate and Rhythm: Normal rate and regular rhythm.     Pulses: Normal pulses.     Heart sounds: Normal heart sounds.  Pulmonary:     Effort: Pulmonary effort is normal.     Breath sounds: Normal breath sounds.  Skin:    Findings: Rash present.     Comments: Pink plaque around her eyes; signs of scratching  Neurological:     Mental Status: She is alert.    BP 111/67 (BP Location: Left Arm, Patient Position: Sitting, Cuff Size: Normal)   Pulse 98   Ht $R'5\' 2"'AF$  (1.575 m)   Wt 105 lb 0.6 oz (47.6 kg)   SpO2 98%   BMI 19.21 kg/m  Wt Readings from Last 3 Encounters:  04/30/21 105 lb 0.6 oz (47.6 kg)  02/20/21 105 lb 12.8 oz (48 kg)  11/27/20 112 lb (50.8 kg)    Health Maintenance Due  Topic Date Due   HIV Screening  Never done   TETANUS/TDAP  Never done   INFLUENZA VACCINE  Never done    There are no preventive care reminders to display for this patient.   No results found for: TSH Lab Results  Component Value Date   WBC 4.1 11/07/2020   HGB 13.5 11/07/2020   HCT 40.3 11/07/2020   MCV 97 11/07/2020   PLT 348 11/07/2020   Lab Results  Component Value Date   NA 140 11/07/2020   K 4.3 11/07/2020   CO2 25 11/07/2020   GLUCOSE 81 11/07/2020   BUN 11 11/07/2020   CREATININE 0.68 11/07/2020   BILITOT 0.8 11/07/2020   ALKPHOS 45 11/07/2020   AST 24 11/07/2020   ALT 26 11/07/2020   PROT 6.7 11/07/2020   ALBUMIN 4.5 11/07/2020   CALCIUM 9.6 11/07/2020   EGFR 121 11/07/2020   Lab Results  Component  Value Date   CHOL 135 11/07/2020   Lab Results  Component Value  Date   HDL 38 (L) 11/07/2020   Lab Results  Component Value Date   LDLCALC 80 11/07/2020   Lab Results  Component Value Date   TRIG 90 11/07/2020   No results found for: CHOLHDL No results found for: HGBA1C     Assessment & Plan:   Problem List Items Addressed This Visit       Digestive   Dysphagia - Primary    -referral to GI -lungs clear, no sign of aspiration PNA -may consider SLP consult based on GI findings      Relevant Orders   Ambulatory referral to Gastroenterology     Musculoskeletal and Integument   Eczema    -uses betamethasone cream -referral to derm; ? Systemic treatment      Relevant Orders   Ambulatory referral to Dermatology     No orders of the defined types were placed in this encounter.    Noreene Larsson, NP

## 2021-04-30 NOTE — Assessment & Plan Note (Signed)
-  uses betamethasone cream -referral to derm; ? Systemic treatment

## 2021-06-23 ENCOUNTER — Encounter: Payer: Self-pay | Admitting: Dermatology

## 2021-06-23 ENCOUNTER — Other Ambulatory Visit: Payer: Self-pay

## 2021-06-23 ENCOUNTER — Ambulatory Visit (INDEPENDENT_AMBULATORY_CARE_PROVIDER_SITE_OTHER): Payer: Medicare Other | Admitting: Dermatology

## 2021-06-23 DIAGNOSIS — L309 Dermatitis, unspecified: Secondary | ICD-10-CM

## 2021-06-23 DIAGNOSIS — L011 Impetiginization of other dermatoses: Secondary | ICD-10-CM | POA: Diagnosis not present

## 2021-06-23 MED ORDER — CLOBETASOL PROP EMOLLIENT BASE 0.05 % EX CREA: TOPICAL_CREAM | CUTANEOUS | 6 refills | Status: DC

## 2021-06-23 MED ORDER — MUPIROCIN 2 % EX OINT
1.0000 | TOPICAL_OINTMENT | Freq: Two times a day (BID) | CUTANEOUS | 6 refills | Status: DC
Start: 2021-06-23 — End: 2022-05-11

## 2021-06-24 ENCOUNTER — Telehealth: Payer: Self-pay | Admitting: *Deleted

## 2021-06-24 DIAGNOSIS — L309 Dermatitis, unspecified: Secondary | ICD-10-CM

## 2021-06-24 MED ORDER — CLOBETASOL PROP EMOLLIENT BASE 0.05 % EX CREA: TOPICAL_CREAM | CUTANEOUS | 6 refills | Status: DC

## 2021-06-24 NOTE — Telephone Encounter (Signed)
Called patient mom Harriett Sine) to let her know per [facility] wanted to change clobetasol cream to something cheaper- informed mom cash [facility].00 at [facility] with goodrx coupon. Mom understood and will call us back if needed.

## 2021-07-11 ENCOUNTER — Encounter: Payer: Self-pay | Admitting: Dermatology

## 2021-07-12 NOTE — Progress Notes (Signed)
° °  New Patient   Subjective  Joanna Fisher is a 30 y.o. female who presents for the following: Skin Problem (Left arm, behind & inside left & right ear- + itch x years tx- betamethasone cream- seems to help, has tired 2 other creams but can't remember name of them ).  Chronic eczema, has used several prescription cortisones including betamethasone with incomplete response Location:  Duration:  Quality:  Associated Signs/Symptoms: Modifying Factors:  Severity:  Timing: Context:    The following portions of the chart were reviewed this encounter and updated as appropriate:  Tobacco   Allergies   Meds   Problems   Med Hx   Surg Hx   Fam Hx       Objective  Well appearing patient in no apparent distress; mood and affect are within normal limits. Left Forearm - Anterior, Left Postauricular Sulcus, Right Cavum, Right Postauricular Sulcus With patient throughout visit and help provide history.  Patchy roughly symmetrical eczematous dermatitis compatible with atopic dermatitis.    A focused examination was performed including head, neck, back, arms, legs, photographs.  I. Relevant physical exam findings are noted in the Assessment and Plan.   Assessment & Plan  Eczema, unspecified type Left Forearm - Anterior; Right Cavum; Left Postauricular Sulcus; Right Postauricular Sulcus  Extended discussion about the cause of this common disorder along with emphasis on need to keep skin hydrated.  Essentially all treatment options discussed.  For 30 days we will switch from her current topical steroid to clobetasol verbal areas examined body folds and face.  Initially on her left ear she will do 10 days of twice daily mupirocin to help cover secondary impetiginization.  Initial follow-up by phone in 4 weeks.  mupirocin ointment (BACTROBAN) 2 % - Left Forearm - Anterior, Left Postauricular Sulcus, Right Cavum, Right Postauricular Sulcus Apply 1 application topically 2 (two) times daily. Apply  to affected area bid  Clobetasol Prop Emollient Base (CLOBETASOL PROPIONATE E) 0.05 % emollient cream - Left Forearm - Anterior, Left Postauricular Sulcus, Right Cavum, Right Postauricular Sulcus Apply to affected area qd- not for face, fold or groin

## 2021-07-15 ENCOUNTER — Ambulatory Visit (INDEPENDENT_AMBULATORY_CARE_PROVIDER_SITE_OTHER): Payer: Medicare Other | Admitting: Dermatology

## 2021-07-15 ENCOUNTER — Encounter: Payer: Self-pay | Admitting: Dermatology

## 2021-07-15 ENCOUNTER — Other Ambulatory Visit: Payer: Self-pay

## 2021-07-15 DIAGNOSIS — L309 Dermatitis, unspecified: Secondary | ICD-10-CM

## 2021-07-15 NOTE — Patient Instructions (Addendum)
Patient will continue regimen, meds are to be used when flare up occurs. When there is no flare, regular moisturizers can be used. Samples have been given to the patients

## 2021-08-09 ENCOUNTER — Encounter: Payer: Self-pay | Admitting: Dermatology

## 2021-08-09 NOTE — Progress Notes (Signed)
° °  Follow-Up Visit  Recheck eczema.   Subjective  Joanna Fisher is a 31 y.o. female who presents for the following: Eczema (Eczema f/u - better using tx- clobetasol cream). Location:  Duration:  Quality:  Associated Signs/Symptoms: Modifying Factors:  Severity:  Timing: Context:   Objective  Well appearing patient in no apparent distress; mood and affect are within normal limits. Left Arm, Left Ear, Right Arm, Right Ear Mother in room throughout visit.  Active eczema 80% improved, left ear 90% improved with clearance of secondary infection.     A focused examination was performed including head, neck, arms. Relevant physical exam findings are noted in the Assessment and Plan.   Assessment & Plan    Eczema, unspecified type Left Ear; Right Ear; Left Arm; Right Arm  Great Improvement noticed.  Again reviewed concept of 2 phases of treating eczema, prescription medication for areas with active visible rash or itching, proper hydration when symptoms are improved to minimize flares. Maintenance is pts choice of moisturizer   Related Medications mupirocin ointment (BACTROBAN) 2 % Apply 1 application topically 2 (two) times daily. Apply to affected area bid  Clobetasol Prop Emollient Base (CLOBETASOL PROPIONATE E) 0.05 % emollient cream Apply to affected area qd- not for face, fold or groin      I, Janalyn Harder, MD, have reviewed all documentation for this visit.  The documentation on 08/09/21 for the exam, diagnosis, procedures, and orders are all accurate and complete.

## 2021-09-11 ENCOUNTER — Ambulatory Visit: Payer: Medicare Other | Admitting: Gastroenterology

## 2021-11-27 ENCOUNTER — Ambulatory Visit: Payer: Medicare Other | Admitting: Nurse Practitioner

## 2021-11-27 ENCOUNTER — Encounter: Payer: Self-pay | Admitting: Nurse Practitioner

## 2021-11-27 ENCOUNTER — Ambulatory Visit (INDEPENDENT_AMBULATORY_CARE_PROVIDER_SITE_OTHER): Payer: Medicare Other | Admitting: Nurse Practitioner

## 2021-11-27 VITALS — BP 105/77 | HR 83 | Ht 62.0 in | Wt 107.0 lb

## 2021-11-27 DIAGNOSIS — E559 Vitamin D deficiency, unspecified: Secondary | ICD-10-CM | POA: Diagnosis not present

## 2021-11-27 DIAGNOSIS — Z139 Encounter for screening, unspecified: Secondary | ICD-10-CM

## 2021-11-27 DIAGNOSIS — G43009 Migraine without aura, not intractable, without status migrainosus: Secondary | ICD-10-CM

## 2021-11-27 DIAGNOSIS — J302 Other seasonal allergic rhinitis: Secondary | ICD-10-CM

## 2021-11-27 NOTE — Assessment & Plan Note (Signed)
Condition well-controlled ?Continue Imitrex as needed. ?

## 2021-11-27 NOTE — Progress Notes (Signed)
? ?  Joanna Fisher     MRN: [id number]      DOB: [birth year] ? ? ?HPI ?[name] with past medical history of migraine, eczema, autistic disorder, seasonal allergies is here for follow up and re-evaluation of chronic medical conditions, patient is accompanied to today's visit by her mom who contributes to her history ? ? ?Pt has been having uncontrolled allergies, sneezing , nasal drainage, , gags a lot with post nasal drainage, they have tried claritin, zyrtec, allegra, they did not seem to help much, currently taking OTC cough and cold HBP, this has helped her allergies better than other medications. PT mother stated that the patient refuses nasal spray. Denies SOB , wheezing, CP . Its been a few years since she last saw an allergist. Pollens makes her symptoms worse.  ? ? ? ?Migraines. Well controlled, goes 3 months sometimes without migraine and some months she has about  3 episodes, when they go camping they go with imitrex since allergies gives her migraines.  ? ? ? ?Due for TDAP, vaccines, patient encouraged to get vaccine at her pharmacy he verbalized understanding. ? ? ?ROS ?Denies recent fever or chills. ?Denies sinus pressure, nasal congestion, ear pain or sore throat. ?Denies chest congestion, productive cough or wheezing. ?Denies chest pains, palpitations and leg swelling ?Denies abdominal pain, nausea, vomiting,diarrhea or constipation.   ?Denies dysuria, frequency, hesitancy or incontinence. ?Denies joint pain, swelling and limitation in mobility. ?Denies headaches, seizures, numbness, or tingling. ?Denies depression, anxiety or insomnia. ? ? ? ?PE ? ?BP 105/77 (BP Location: Left Arm, Patient Position: Sitting, Cuff Size: Normal)   Pulse 83   Ht 5\' 2"  (1.575 m)   Wt 107 lb (48.5 kg)   LMP 11/21/2021 (Exact Date)   SpO2 99%   BMI 19.57 kg/m?  ? ?Patient alert and oriented and in no cardiopulmonary distress. ? ?HEENT: No facial asymmetry, EOMI,     Neck supple . ? ?Chest: Clear to auscultation  bilaterally. ? ?CVS: S1, S2 no murmurs, no S3.Regular rate. ? ?ABD: Soft non tender.  ? ?Ext: No edema ? ?MS: Adequate ROM spine, shoulders, hips and knees. ? ?Psych: Good eye contact, normal affect. Memory intact not anxious or depressed appearing. ? ? ? ?Assessment & Plan ? ?Migraine ?Condition well-controlled ?Continue Imitrex as needed. ? ?Seasonal allergies ?Uncontrolled condition  ?currently taking OTC cough and cold HBP, ?Patient has been refusing intral nasal spray due to her autistic disorder.  ?Referred to allergist. ?  ?

## 2021-11-27 NOTE — Patient Instructions (Signed)
Please get your fasting labs done 3-5 days before your next visit. ?Please get your TDAP vaccine at your pharmacy.  ? ? ?It is important that you exercise regularly at least 30 minutes 5 times a week.  ?Think about what you will eat, plan ahead. ?Choose " clean, green, fresh or frozen" over canned, processed or packaged foods which are more sugary, salty and fatty. ?70 to 75% of food eaten should be vegetables and fruit. ?Three meals at set times with snacks allowed between meals, but they must be fruit or vegetables. ?Aim to eat over a 12 hour period , example 7 am to 7 pm, and STOP after  your last meal of the day. ?Drink water,generally about 64 ounces per day, no other drink is as healthy. Fruit juice is best enjoyed in a healthy way, by EATING the fruit. ? ?Thanks for choosing [facility], we consider it a privelige to serve you.  ?

## 2021-11-27 NOTE — Assessment & Plan Note (Addendum)
Uncontrolled condition  ?currently taking OTC cough and cold HBP, ?Patient has been refusing intral nasal spray due to her autistic disorder.  ?Referred to allergist. ? ?

## 2022-03-31 ENCOUNTER — Encounter: Payer: Self-pay | Admitting: Nurse Practitioner

## 2022-03-31 ENCOUNTER — Ambulatory Visit (INDEPENDENT_AMBULATORY_CARE_PROVIDER_SITE_OTHER): Payer: Medicare Other | Admitting: Nurse Practitioner

## 2022-03-31 VITALS — BP 94/64 | HR 97 | Ht 64.0 in | Wt 106.0 lb

## 2022-03-31 DIAGNOSIS — Z1329 Encounter for screening for other suspected endocrine disorder: Secondary | ICD-10-CM

## 2022-03-31 DIAGNOSIS — R636 Underweight: Secondary | ICD-10-CM

## 2022-03-31 DIAGNOSIS — Z13220 Encounter for screening for lipoid disorders: Secondary | ICD-10-CM

## 2022-03-31 DIAGNOSIS — Z23 Encounter for immunization: Secondary | ICD-10-CM | POA: Diagnosis not present

## 2022-03-31 DIAGNOSIS — G43009 Migraine without aura, not intractable, without status migrainosus: Secondary | ICD-10-CM

## 2022-03-31 DIAGNOSIS — L309 Dermatitis, unspecified: Secondary | ICD-10-CM | POA: Diagnosis not present

## 2022-03-31 DIAGNOSIS — Z139 Encounter for screening, unspecified: Secondary | ICD-10-CM | POA: Diagnosis not present

## 2022-03-31 MED ORDER — CLOBETASOL PROP EMOLLIENT BASE 0.05 % EX CREA: TOPICAL_CREAM | CUTANEOUS | 6 refills | Status: DC

## 2022-03-31 MED ORDER — SUMATRIPTAN SUCCINATE 50 MG PO TABS
50.0000 mg | ORAL_TABLET | ORAL | 2 refills | Status: DC | PRN
Start: 2022-03-31 — End: 2023-04-15

## 2022-03-31 NOTE — Assessment & Plan Note (Signed)
Well-controlled on betamethasone cream as needed Medication refilled

## 2022-03-31 NOTE — Patient Instructions (Addendum)
Please get your TDAP vaccine at the pharmacy    It is important that you exercise regularly at least 30 minutes 5 times a week.  Think about what you will eat, plan ahead. Choose " clean, green, fresh or frozen" over canned, processed or packaged foods which are more sugary, salty and fatty. 70 to 75% of food eaten should be vegetables and fruit. Three meals at set times with snacks allowed between meals, but they must be fruit or vegetables. Aim to eat over a 12 hour period , example 7 am to 7 pm, and STOP after  your last meal of the day. Drink water,generally about 64 ounces per day, no other drink is as healthy. Fruit juice is best enjoyed in a healthy way, by EATING the fruit.  Thanks for choosing [facility], we consider it a privelige to serve you.  

## 2022-03-31 NOTE — Assessment & Plan Note (Addendum)
Wt Readings from Last 3 Encounters:  03/31/22 106 lb (48.1 kg)  11/27/21 107 lb (48.5 kg)  04/30/21 105 lb 0.6 oz (47.6 kg)  Weight remains stable Denies loss of appetite states that she is eating well Check CBC, TSH

## 2022-03-31 NOTE — Assessment & Plan Note (Signed)
Chronic condition well-controlled on Imitrex 50 mg as needed Med patient refilled

## 2022-03-31 NOTE — Progress Notes (Signed)
   Joanna Fisher     MRN: [id number]      DOB: Jul 25, 1991   HPI Joanna Fisher with past medical history of migraine, eczema, dysphagia is here for follow up and re-evaluation of chronic medical conditions, medication management . Patient is accompanied to today's visit by her mother.  The PT denies any adverse reactions to current medications since the last visit.  There are no new concerns.    Migraine HA, gets about 3 migraine a month, sometimes skips a month without one , takes imitrex as needed.  Due for flu vaccine flu vaccine given today, pt encouraged to get her TDAP vaccine at the pharmacy        ROS Denies recent fever or chills. Denies sinus pressure, nasal congestion, ear pain or sore throat. Denies chest congestion, productive cough or wheezing. Denies chest pains, palpitations and leg swelling Denies abdominal pain, nausea, vomiting,diarrhea or constipation.   Denies dysuria, frequency, hesitancy or incontinence. Denies joint pain, swelling and limitation in mobility. Denies eizures, numbness, or tingling. Denies depression, anxiety or insomnia.    PE  BP 94/64 (BP Location: Left Arm, Patient Position: Sitting, Cuff Size: Normal)   Pulse 97   Ht 5\' 4"  (1.626 m)   Wt 106 lb (48.1 kg)   LMP 03/09/2022 (Approximate)   SpO2 95%   BMI 18.19 kg/m   Patient alert and oriented and in no cardiopulmonary distress.  HEENT: No facial asymmetry, EOMI,     Neck supple .  Chest: Clear to auscultation bilaterally.  CVS: S1, S2 no murmurs, no S3.Regular rate.  ABD: Soft non tender.   Ext: No edema  MS: Adequate ROM spine, shoulders, hips and knees.  Psych: Good eye contact, normal affect. Memory at baseline not anxious or depressed appearing.  CNS: CN 2-12 intact, power,  normal throughout.no focal deficits noted.   Assessment & Plan  Low weight Wt Readings from Last 3 Encounters:  03/31/22 106 lb (48.1 kg)  11/27/21 107 lb (48.5 kg)  04/30/21 105 lb  0.6 oz (47.6 kg)  Weight remains stable Denies loss of appetite states that she is eating well Check CBC, TSH  Eczema Well-controlled on betamethasone cream as needed Medication refilled  Migraine Chronic condition well-controlled on Imitrex 50 mg as needed Med patient refilled  Need for immunization against influenza Patient educated on CDC recommendation for the vaccine. Verbal consent was obtained from the patient, vaccine administered by nurse, no sign of adverse reactions noted at this time. Patient education on arm soreness and use of tylenol for this patient  was discussed. Patient educated on the signs and symptoms of adverse effect and advise to contact the office if they occur..   Screening for lipid disorders Check lipid panel Lab Results  Component Value Date   CHOL 135 11/07/2020   HDL 38 (L) 11/07/2020   LDLCALC 80 11/07/2020   TRIG 90 11/07/2020

## 2022-03-31 NOTE — Assessment & Plan Note (Signed)
Patient educated on CDC recommendation for the vaccine. Verbal consent was obtained from the patient, vaccine administered by nurse, no sign of adverse reactions noted at this time. Patient education on arm soreness and use of tylenol  for this patient  was discussed. Patient educated on the signs and symptoms of adverse effect and advise to contact the office if they occur.  ?

## 2022-03-31 NOTE — Assessment & Plan Note (Signed)
Check lipid panel Lab Results  Component Value Date   CHOL 135 11/07/2020   HDL 38 (L) 11/07/2020   LDLCALC 80 11/07/2020   TRIG 90 11/07/2020

## 2022-04-03 LAB — CBC
Hematocrit: 40.2 % (ref 34.0–46.6)
Hemoglobin: 13.6 g/dL (ref 11.1–15.9)
MCH: 33 pg (ref 26.6–33.0)
MCHC: 33.8 g/dL (ref 31.5–35.7)
MCV: 98 fL — ABNORMAL HIGH (ref 79–97)
Platelets: 363 10*3/uL (ref 150–450)
RBC: 4.12 x10E6/uL (ref 3.77–5.28)
RDW: 11.9 % (ref 11.7–15.4)
WBC: 3.9 10*3/uL (ref 3.4–10.8)

## 2022-04-03 LAB — LIPID PANEL
Chol/HDL Ratio: 3.9 ratio (ref 0.0–4.4)
Cholesterol, Total: 149 mg/dL (ref 100–199)
HDL: 38 mg/dL — ABNORMAL LOW (ref >39)
LDL Chol Calc (NIH): 95 mg/dL (ref 0–99)
Triglycerides: 82 mg/dL (ref 0–149)
VLDL Cholesterol Cal: 16 mg/dL (ref 5–40)

## 2022-04-03 LAB — CMP14+EGFR
ALT: 40 IU/L — ABNORMAL HIGH (ref 0–32)
AST: 31 IU/L (ref 0–40)
Albumin/Globulin Ratio: 1.6 (ref 1.2–2.2)
Albumin: 4.2 g/dL (ref 3.9–4.9)
Alkaline Phosphatase: 64 IU/L (ref 44–121)
BUN/Creatinine Ratio: 15 (ref 9–23)
BUN: 10 mg/dL (ref 6–20)
Bilirubin Total: 1 mg/dL (ref 0.0–1.2)
CO2: 25 mmol/L (ref 20–29)
Calcium: 9.7 mg/dL (ref 8.7–10.2)
Chloride: 99 mmol/L (ref 96–106)
Creatinine, Ser: 0.67 mg/dL (ref 0.57–1.00)
Globulin, Total: 2.7 g/dL (ref 1.5–4.5)
Glucose: 81 mg/dL (ref 70–99)
Potassium: 4.4 mmol/L (ref 3.5–5.2)
Sodium: 139 mmol/L (ref 134–144)
Total Protein: 6.9 g/dL (ref 6.0–8.5)
eGFR: 120 mL/min/{1.73_m2} (ref >59)

## 2022-04-03 LAB — TSH: TSH: 2.26 u[IU]/mL (ref 0.450–4.500)

## 2022-04-03 NOTE — Progress Notes (Signed)
Liver function is slightly elevated, avoid excessive use of Tylenol. Other labs are within normal range

## 2022-04-29 ENCOUNTER — Ambulatory Visit: Payer: Medicare Other

## 2022-05-11 ENCOUNTER — Encounter: Payer: Self-pay | Admitting: Internal Medicine

## 2022-05-11 ENCOUNTER — Ambulatory Visit (INDEPENDENT_AMBULATORY_CARE_PROVIDER_SITE_OTHER): Payer: Medicare Other | Admitting: Internal Medicine

## 2022-05-11 DIAGNOSIS — Z0000 Encounter for general adult medical examination without abnormal findings: Secondary | ICD-10-CM | POA: Diagnosis not present

## 2022-05-11 NOTE — Progress Notes (Signed)
Subjective:  This is a telephone encounter between [name] and [name] on 05/11/2022 for AWV. The visit was conducted with the patient located at home and [name] at [facility]. The patient's identity was confirmed using their DOB and current address. The patient has consented to being evaluated through a telephone encounter and understands the associated risks (an examination cannot be done and the patient may need to come in for an appointment) / benefits (allows the patient to remain at home, decreasing exposure to coronavirus).     Joanna Fisher is a 31 y.o. female who presents for Medicare Annual (Subsequent) preventive examination.  Review of Systems    Review of Systems  All other systems reviewed and are negative.         Objective:    There were no vitals filed for this visit. There is no height or weight on file to calculate BMI.     04/24/2021    2:32 PM 04/02/2017   10:38 AM  Advanced Directives  Does Patient Have a Medical Advance Directive? No No  Would patient like information on creating a medical advance directive?  Yes (MAU/Ambulatory/Procedural Areas - Information given)    Current Medications (verified) Outpatient Encounter Medications as of 05/11/2022  Medication Sig   diphenhydrAMINE (BENADRYL) 50 MG capsule Take 50 mg by mouth once as needed.   Bioflavonoid Products (VITAMIN C) CHEW Chew by mouth. Once a day   CALCIUM-VITAMIN D PO Take by mouth.   Clobetasol Prop Emollient Base (CLOBETASOL PROPIONATE E) 0.05 % emollient cream Apply to affected area qd- not for face, fold or groin   METAMUCIL FIBER PO Take by mouth. Once a day   Multiple Vitamin (MULTIVITAMIN) tablet Take 1 tablet by mouth daily.   Probiotic Product (PROBIOTIC PO) Take by mouth. Once a day   SUMAtriptan (IMITREX) 50 MG tablet Take 1 tablet (50 mg total) by mouth every 2 (two) hours as needed for migraine. May repeat in 2 hours if headache persists or recurs.    [DISCONTINUED] betamethasone valerate (VALISONE) 0.1 % cream Apply topically as needed. (Patient not taking: Reported on 11/27/2021)   [DISCONTINUED] mupirocin ointment (BACTROBAN) 2 % Apply 1 application topically 2 (two) times daily. Apply to affected area bid (Patient not taking: Reported on 11/27/2021)   No facility-administered encounter medications on file as of 05/11/2022.    Allergies (verified) Dust mite extract   History: Past Medical History:  Diagnosis Date   Allergic rhinitis    ASCUS of cervix with negative high risk HPV 04/08/2017   Repeat in 1 year    Autistic disorder    CP (cerebral palsy), hypotonic (HCC)    Eczema    Hypotonia    Migraine 09/18/2015   MRSA (methicillin resistant Staphylococcus aureus) 07/17/2014   Past Surgical History:  Procedure Laterality Date   NONE TO DATE     As of 12/14/17   Family History  Problem Relation Age of Onset   Heart disease Other    Arthritis Other    Cancer Other    Asthma Other    Diabetes Other    Dementia Paternal Grandfather    Heart failure Paternal Grandfather    Diabetes Paternal Grandmother    Hypertension Paternal Grandmother    Fibromyalgia Paternal Grandmother    COPD Paternal Grandmother    Heart disease Maternal Grandmother    Heart disease Maternal Grandfather    Liver cancer Maternal Grandfather  liver   Hypertension Father    Hypertension Mother    Heart attack Mother    Other Mother        Hemachromatosis carrier   Hypertension Brother    Hyperlipidemia Brother    Social History   Socioeconomic History   Marital status: Single    Spouse name: Not on file   Number of children: Not on file   Years of education: 12th grade   Highest education level: Not on file  Occupational History   Occupation: na    Employer: not employed  Tobacco Use   Smoking status: Never   Smokeless tobacco: Never  Substance and Sexual Activity   Alcohol use: No   Drug use: No   Sexual activity: Never   Other Topics Concern   Not on file  Social History Narrative   Lives with her mother    Social Determinants of Health   Financial Resource Strain: Low Risk  (04/24/2021)   Overall Financial Resource Strain (CARDIA)    Difficulty of Paying Living Expenses: Not hard at all  Food Insecurity: No Food Insecurity (04/24/2021)   Hunger Vital Sign    Worried About Running Out of Food in the Last Year: Never true    Ran Out of Food in the Last Year: Never true  Transportation Needs: No Transportation Needs (04/24/2021)   PRAPARE - Administrator, Civil Service (Medical): No    Lack of Transportation (Non-Medical): No  Physical Activity: Inactive (04/24/2021)   Exercise Vital Sign    Days of Exercise per Week: 0 days    Minutes of Exercise per Session: 0 min  Stress: No Stress Concern Present (04/24/2021)   [employer] of Occupational Health - Occupational Stress Questionnaire    Feeling of Stress : Not at all  Social Connections: Socially Isolated (04/24/2021)   Social Connection and Isolation Panel [NHANES]    Frequency of Communication with Friends and Family: Never    Frequency of Social Gatherings with Friends and Family: Never    Attends Religious Services: More than 4 times per year    Active Member of [employer] or Organizations: No    Attends Engineer, structural: Never    Marital Status: Never married    Tobacco Counseling Counseling given: Not Answered   Clinical Intake:  Pre-visit preparation completed: Yes  Pain : No/denies pain     Diabetes: No  How often do you need to have someone help you when you read instructions, pamphlets, or other written materials from your doctor or pharmacy?: 5 - Always What is the last grade level you completed in school?: Home schooled for 12th grade, but did not finish 12 grades. Reads up to 6th grade level  Diabetic?No         Activities of Daily Living     No data to display          Patient Care  Team: Billie Lade, MD as PCP - General (Internal Medicine) West Bali, MD (Inactive) as Consulting Physician (Gastroenterology)  Indicate any recent Medical Services you may have received from other than [facility] providers in the past year (date may be approximate).     Assessment:   This is a routine wellness examination for [name].  Hearing/Vision screen No results found.  Dietary issues and exercise activities discussed:     Goals Addressed   None    Depression Screen    03/31/2022    9:06 AM 11/27/2021  8:08 AM 04/30/2021   10:23 AM 04/24/2021    2:29 PM 02/20/2021    9:38 AM 11/27/2020    8:08 AM 10/28/2020    8:12 AM  PHQ 2/9 Scores  PHQ - 2 Score 0 0 0 0 0 0 0  PHQ- 9 Score     0      Fall Risk    03/31/2022    9:06 AM 11/27/2021    8:08 AM 04/30/2021   10:23 AM 04/24/2021    2:32 PM 02/20/2021    9:38 AM  Fall Risk   Falls in the past year? 0 0 0 0 0  Number falls in past yr: 0 0 0 0 0  Injury with Fall? 0 0 0 0 0  Risk for fall due to : No Fall Risks No Fall Risks No Fall Risks No Fall Risks   Follow up Falls evaluation completed Falls evaluation completed Falls evaluation completed Falls evaluation completed       Cognitive Function:        04/24/2021    2:33 PM  6CIT Screen  What Year? 0 points  What month? 0 points  What time? 0 points  Count back from 20 0 points  Months in reverse 0 points  Repeat phrase 0 points  Total Score 0 points    Immunizations Immunization History  Administered Date(s) Administered   Influenza,inj,Quad PF,6+ Mos 03/31/2022    TDAP status: Due, Education has been provided regarding the importance of this vaccine. Advised may receive this vaccine at local pharmacy or Health Dept. Aware to provide a copy of the vaccination record if obtained from local pharmacy or Health Dept. Verbalized acceptance and understanding.  Flu Vaccine status: Up to date  Pneumococcal vaccine status: Up to date  Covid-19 vaccine  status: Declined, Education has been provided regarding the importance of this vaccine but patient still declined. Advised may receive this vaccine at local pharmacy or Health Dept.or vaccine clinic. Aware to provide a copy of the vaccination record if obtained from local pharmacy or Health Dept. Verbalized acceptance and understanding.  Qualifies for Shingles Vaccine? No   Zostavax completed No   Shingrix Completed?: No.    Education has been provided regarding the importance of this vaccine. Patient has been advised to call insurance company to determine out of pocket expense if they have not yet received this vaccine. Advised may also receive vaccine at local pharmacy or Health Dept. Verbalized acceptance and understanding.  Screening Tests Health Maintenance  Topic Date Due   HIV Screening  Never done   TETANUS/TDAP  Never done   PAP SMEAR-Modifier  02/21/2024   INFLUENZA VACCINE  Completed   Hepatitis C Screening  Completed   HPV VACCINES  Aged Out   COVID-19 Vaccine  Discontinued    Health Maintenance  Health Maintenance Due  Topic Date Due   HIV Screening  Never done   TETANUS/TDAP  Never done     Lung Cancer Screening: (Low Dose CT Chest recommended if Age 54-80 years, 30 pack-year currently smoking OR have quit w/in 15years.) does not qualify.   Additional Screening:  Hepatitis C Screening: does qualify; Completed 11/07/2020  Vision Screening: Recommended annual ophthalmology exams for early detection of glaucoma and other disorders of the eye. Is the patient up to date with their annual eye exam?  Yes  Who is the provider or what is the name of the office in which the patient attends annual eye exams? no If pt  is not established with a provider, would they like to be referred to a provider to establish care? Yes .   Dental Screening: Recommended annual dental exams for proper oral hygiene  Community Resource Referral / Chronic Care Management: CRR required this  visit?  No   CCM required this visit?  No      Plan:     I have personally reviewed and noted the following in the patient's chart:   Medical and social history Use of alcohol, tobacco or illicit drugs  Current medications and supplements including opioid prescriptions. Patient is not currently taking opioid prescriptions. Functional ability and status Nutritional status Physical activity Advanced directives List of other physicians Hospitalizations, surgeries, and ER visits in previous 12 months Vitals Screenings to include cognitive, depression, and falls Referrals and appointments  In addition, I have reviewed and discussed with patient certain preventive protocols, quality metrics, and best practice recommendations. A written personalized care plan for preventive services as well as general preventive health recommendations were provided to patient.     [name], MD   05/11/2022

## 2022-05-11 NOTE — Patient Instructions (Addendum)
  Joanna Fisher , Thank you for taking time to come for your Medicare Wellness Visit. I appreciate your ongoing commitment to your health goals. Please review the following plan we discussed and let me know if I can assist you in the future.   These are the goals we discussed: Eye exam Referral to [facility] for help with dental referral  This is a list of the screening recommended for you and due dates:  Health Maintenance  Topic Date Due   HIV Screening  Never done   Tetanus Vaccine  Never done   Pap Smear  02/21/2024   Flu Shot  Completed   Hepatitis C Screening: USPSTF Recommendation to screen - Ages 18-79 yo.  Completed   HPV Vaccine  Aged Out   COVID-19 Vaccine  Discontinued

## 2022-05-13 ENCOUNTER — Other Ambulatory Visit: Payer: Self-pay | Admitting: Internal Medicine

## 2022-05-13 DIAGNOSIS — Z7689 Persons encountering health services in other specified circumstances: Secondary | ICD-10-CM

## 2022-05-14 ENCOUNTER — Telehealth: Payer: Self-pay | Admitting: *Deleted

## 2022-05-14 NOTE — Telephone Encounter (Signed)
   Telephone encounter was:  Unsuccessful.  05/14/2022 Name: Joanna Fisher MRN: [id number] DOB: [birth year]  Unsuccessful outbound call made today to assist with:   DENTAL  Outreach Attempt:  1st Attempt  A HIPAA compliant voice message was left requesting a return call.  Instructed patient to call back at [phone]. [place] [phone] [address] , [place] [address] Email : [email or web address]

## 2022-05-21 ENCOUNTER — Telehealth: Payer: Self-pay | Admitting: *Deleted

## 2022-05-21 NOTE — Telephone Encounter (Signed)
   Telephone encounter was:  Unsuccessful.  05/21/2022 Name: NARCISSUS DETWILER MRN: [id number] DOB: 06-24-1991  Unsuccessful outbound call made today to assist with:   dental  Outreach Attempt:  2nd Attempt  A HIPAA compliant voice message was left requesting a return call.  Instructed patient to call back at [phone].  [place] [phone] [address] , [place] [address] Email : [email or web address]

## 2022-05-26 ENCOUNTER — Telehealth: Payer: Self-pay | Admitting: *Deleted

## 2022-05-26 NOTE — Telephone Encounter (Signed)
   Telephone encounter was:  Successful.  05/26/2022 Name: Joanna Fisher MRN: [id number] DOB: August 19, 1990  Joanna Fisher is a 31 y.o. year old female who is a primary care patient of Doren Custard, Hazle Nordmann, MD . The community resource team was consulted for assistance with  Dental Will mail dental information as unable to reach  Care guide performed the following interventions: Mailed resources for dentists . Follow Up Plan:  No further follow up planned at this time. The patient has been provided with needed resources.  [place] [phone] [address] , [place] [address] Email : [email or web address]

## 2022-06-16 ENCOUNTER — Encounter: Payer: Self-pay | Admitting: Internal Medicine

## 2022-06-16 ENCOUNTER — Ambulatory Visit (INDEPENDENT_AMBULATORY_CARE_PROVIDER_SITE_OTHER): Payer: Medicare Other | Admitting: Internal Medicine

## 2022-06-16 ENCOUNTER — Ambulatory Visit: Payer: Medicare Other | Admitting: Family Medicine

## 2022-06-16 DIAGNOSIS — J019 Acute sinusitis, unspecified: Secondary | ICD-10-CM | POA: Diagnosis not present

## 2022-06-16 MED ORDER — AMOXICILLIN 875 MG PO TABS: 875.0000 mg | ORAL_TABLET | Freq: Two times a day (BID) | ORAL | 0 refills | Status: AC

## 2022-06-16 MED ORDER — PREDNISONE 20 MG PO TABS: 20.0000 mg | ORAL_TABLET | Freq: Every day | ORAL | 0 refills | Status: AC

## 2022-06-16 NOTE — Progress Notes (Signed)
   Acute Telephone Visit  Virtual Visit via Telephone Note  I connected with Joanna Fisher on 06/16/22 at  1:40 PM EST by telephone and verified that I am speaking with the correct person using two identifiers.  Location: Patient: [address], [place], [place] [address] Provider: [phone] [address], [place], [place] [address]  I discussed the limitations, risks, security and privacy concerns of performing an evaluation and management service by telephone and the availability of in person appointments. I also discussed with the patient that there may be a patient responsible charge related to this service. The patient expressed understanding and agreed to proceed.  History of Present Illness:  Joanna Fisher was evaluated today via acute telephone encounter for symptoms of nasal and sinus congestion, persistent cough, and throat irritation.  Her symptoms began 1 week ago.  She was exposed to sick contacts at her church, who have since tested positive for strep pharyngitis and RSV.  She has been afebrile.  Her nasal drainage has been clear, although she has a history of hypotonic cerebral palsy and has difficulty clearing secretions.  She has been taking over-the-counter cold medications and allergy medications without sustained symptom relief.  Overall, she feels as though her symptoms are getting worse.Marland Kitchen  Her cough is her most bothersome symptom currently.  Assessment and Plan:  Acute Sinusitis Telephone encounter held today for evaluation of the symptoms noted above.  Her symptoms seem most consistent with sinusitis.  Given her impaired ability to clear secretions, there could attentionally be a bacterial component to her infection.  Accordingly, I have prescribed amoxicillin 875 mg twice daily x10 days.  Her cough is also quite bothersome and may be a component of bronchitis.  I have also prescribed prednisone 20 mg x 5 days.  She was instructed to present for in person evaluation if she develops  fever or her drainage changes in quantity or quality.  Otherwise she has follow-up scheduled with me for September 2024.  Follow Up Instructions:  I discussed the assessment and treatment plan with the patient. The patient was provided an opportunity to ask questions and all were answered. The patient agreed with the plan and demonstrated an understanding of the instructions.   The patient was advised to call back or seek an in-person evaluation if the symptoms worsen or if the condition fails to improve as anticipated.  I provided 7 minutes of non-face-to-face time during this encounter.   Billie Lade, MD

## 2022-06-17 ENCOUNTER — Ambulatory Visit: Payer: Medicare Other | Admitting: Family Medicine

## 2023-04-06 ENCOUNTER — Encounter: Payer: Medicare Other | Admitting: Internal Medicine

## 2023-04-15 ENCOUNTER — Encounter: Payer: Self-pay | Admitting: Internal Medicine

## 2023-04-15 ENCOUNTER — Ambulatory Visit (INDEPENDENT_AMBULATORY_CARE_PROVIDER_SITE_OTHER): Payer: Medicare Other | Admitting: Internal Medicine

## 2023-04-15 VITALS — BP 90/58 | HR 95 | Ht 62.0 in | Wt 105.2 lb

## 2023-04-15 DIAGNOSIS — Z0001 Encounter for general adult medical examination with abnormal findings: Secondary | ICD-10-CM

## 2023-04-15 DIAGNOSIS — Z23 Encounter for immunization: Secondary | ICD-10-CM

## 2023-04-15 DIAGNOSIS — I959 Hypotension, unspecified: Secondary | ICD-10-CM

## 2023-04-15 DIAGNOSIS — E559 Vitamin D deficiency, unspecified: Secondary | ICD-10-CM

## 2023-04-15 DIAGNOSIS — L309 Dermatitis, unspecified: Secondary | ICD-10-CM

## 2023-04-15 DIAGNOSIS — G43009 Migraine without aura, not intractable, without status migrainosus: Secondary | ICD-10-CM | POA: Diagnosis not present

## 2023-04-15 DIAGNOSIS — Z1329 Encounter for screening for other suspected endocrine disorder: Secondary | ICD-10-CM

## 2023-04-15 DIAGNOSIS — Z135 Encounter for screening for eye and ear disorders: Secondary | ICD-10-CM | POA: Diagnosis not present

## 2023-04-15 DIAGNOSIS — Z131 Encounter for screening for diabetes mellitus: Secondary | ICD-10-CM

## 2023-04-15 DIAGNOSIS — Z1321 Encounter for screening for nutritional disorder: Secondary | ICD-10-CM

## 2023-04-15 MED ORDER — CLOBETASOL PROPIONATE E 0.05 % EX CREA: TOPICAL_CREAM | CUTANEOUS | 6 refills | Status: AC

## 2023-04-15 MED ORDER — SUMATRIPTAN SUCCINATE 50 MG PO TABS: 50.0000 mg | ORAL_TABLET | ORAL | 2 refills | Status: AC | PRN

## 2023-04-15 NOTE — Progress Notes (Signed)
Complete physical exam  Patient: Joanna Fisher   DOB: 08-Apr-1991   32 y.o. Female  MRN: [id number]  Subjective:    Chief Complaint  Patient presents with   Annual Exam   Referral    Patient needs a referral to an eye doctor and dentist     NGAIRE Fisher is a 32 y.o. female who presents today for a complete physical exam. She reports consuming a general diet. The patient does not participate in regular exercise at present. She generally feels well. She reports sleeping well. She does have additional problems to discuss today.  Patient's mother requests referral to ophthalmology.  She additionally has a history of eczema and requests a refill of clobetasol cream for rash relief.  Imitrex refill also requested for as needed migraine relief.   Most recent fall risk assessment:    04/15/2023    9:38 AM  Fall Risk   Falls in the past year? 0  Number falls in past yr: 0  Injury with Fall? 0     Most recent depression screenings:    04/15/2023    9:38 AM 06/16/2022    1:41 PM  PHQ 2/9 Scores  PHQ - 2 Score 0 0  PHQ- 9 Score  0    Vision:Not within last year  and Dental: No current dental problems and No regular dental care   Past Medical History:  Diagnosis Date   Allergic rhinitis    ASCUS of cervix with negative high risk HPV 04/08/2017   Repeat in 1 year    Autistic disorder    CP (cerebral palsy), hypotonic (HCC)    Eczema    Hypotonia    Migraine 09/18/2015   MRSA (methicillin resistant Staphylococcus aureus) 07/17/2014   Past Surgical History:  Procedure Laterality Date   NONE TO DATE     As of 12/14/17   Social History   Tobacco Use   Smoking status: Never   Smokeless tobacco: Never  Substance Use Topics   Alcohol use: No   Drug use: No   Family History  Problem Relation Age of Onset   Heart disease Other    Arthritis Other    Cancer Other    Asthma Other    Diabetes Other    Dementia Paternal Grandfather    Heart failure Paternal  Grandfather    Diabetes Paternal Grandmother    Hypertension Paternal Grandmother    Fibromyalgia Paternal Grandmother    COPD Paternal Grandmother    Heart disease Maternal Grandmother    Heart disease Maternal Grandfather    Liver cancer Maternal Grandfather        liver   Hypertension Father    Hypertension Mother    Heart attack Mother    Other Mother        Hemachromatosis carrier   Hypertension Brother    Hyperlipidemia Brother    Allergies  Allergen Reactions   Dust Mite Extract Other (See Comments)    Running nose congestion     Patient Care Team: Billie Lade, MD as PCP - General (Internal Medicine) [place] Bali, MD (Inactive) as Consulting Physician (Gastroenterology)   Outpatient Medications Prior to Visit  Medication Sig   Bioflavonoid Products (VITAMIN C) CHEW Chew by mouth. Once a day   CALCIUM-VITAMIN D PO Take by mouth.   diphenhydrAMINE (BENADRYL) 50 MG capsule Take 50 mg by mouth once as needed.   METAMUCIL FIBER PO Take by mouth. Once a day  Multiple Vitamin (MULTIVITAMIN) tablet Take 1 tablet by mouth daily.   Probiotic Product (PROBIOTIC PO) Take by mouth. Once a day   [DISCONTINUED] Clobetasol Prop Emollient Base (CLOBETASOL PROPIONATE E) 0.05 % emollient cream Apply to affected area qd- not for face, fold or groin   [DISCONTINUED] SUMAtriptan (IMITREX) 50 MG tablet Take 1 tablet (50 mg total) by mouth every 2 (two) hours as needed for migraine. May repeat in 2 hours if headache persists or recurs.   No facility-administered medications prior to visit.    Review of Systems  Constitutional:  Negative for chills and fever.  HENT:  Negative for sore throat.   Respiratory:  Negative for cough and shortness of breath.   Cardiovascular:  Negative for chest pain, palpitations and leg swelling.  Gastrointestinal:  Negative for abdominal pain, blood in stool, constipation, diarrhea, nausea and vomiting.  Genitourinary:  Negative for dysuria and  hematuria.  Musculoskeletal:  Negative for myalgias.  Skin:  Positive for rash (Eczema). Negative for itching.  Neurological:  Negative for dizziness and headaches.  Psychiatric/Behavioral:  Negative for depression and suicidal ideas.       Objective:     BP (!) 90/58   Pulse 95   Ht 5\' 2"  (1.575 m)   Wt 105 lb 3.2 oz (47.7 kg)   SpO2 97%   BMI 19.24 kg/m  BP Readings from Last 3 Encounters:  04/15/23 (!) 90/58  03/31/22 94/64  11/27/21 105/77   Physical Exam Vitals reviewed.  Constitutional:      General: She is not in acute distress.    Appearance: Normal appearance. She is not toxic-appearing.  HENT:     Head: Normocephalic and atraumatic.     Right Ear: External ear normal.     Left Ear: External ear normal.     Nose: Nose normal. No congestion or rhinorrhea.     Mouth/Throat:     Mouth: Mucous membranes are moist.     Pharynx: Oropharynx is clear. No oropharyngeal exudate or posterior oropharyngeal erythema.  Eyes:     General: No scleral icterus.    Extraocular Movements: Extraocular movements intact.     Conjunctiva/sclera: Conjunctivae normal.     Pupils: Pupils are equal, round, and reactive to light.  Cardiovascular:     Rate and Rhythm: Normal rate and regular rhythm.     Pulses: Normal pulses.     Heart sounds: Normal heart sounds. No murmur heard.    No friction rub. No gallop.  Pulmonary:     Effort: Pulmonary effort is normal.     Breath sounds: Normal breath sounds. No wheezing, rhonchi or rales.  Abdominal:     General: Abdomen is flat. Bowel sounds are normal. There is no distension.     Palpations: Abdomen is soft.     Tenderness: There is no abdominal tenderness.  Musculoskeletal:        General: No swelling. Normal range of motion.     Cervical back: Normal range of motion.     Right lower leg: No edema.     Left lower leg: No edema.  Lymphadenopathy:     Cervical: No cervical adenopathy.  Skin:    General: Skin is warm and dry.      Capillary Refill: Capillary refill takes less than 2 seconds.     Coloration: Skin is not jaundiced.     Findings: Rash (Eczematous rash present on flexor surfaces of each elbow.) present.  Neurological:     General: No  focal deficit present.     Mental Status: She is alert and oriented to person, place, and time.  Psychiatric:        Mood and Affect: Mood normal.        Behavior: Behavior normal.   Last CBC Lab Results  Component Value Date   WBC 4.6 04/15/2023   HGB 13.1 04/15/2023   HCT 39.5 04/15/2023   MCV 99 (H) 04/15/2023   MCH 32.8 04/15/2023   RDW 11.8 04/15/2023   PLT 375 04/15/2023   Last metabolic panel Lab Results  Component Value Date   GLUCOSE 66 (L) 04/15/2023   NA 142 04/15/2023   K 4.3 04/15/2023   CL 103 04/15/2023   CO2 26 04/15/2023   BUN 11 04/15/2023   CREATININE 0.66 04/15/2023   EGFR 119 04/15/2023   CALCIUM 9.5 04/15/2023   PROT 6.4 04/15/2023   ALBUMIN 4.3 04/15/2023   LABGLOB 2.1 04/15/2023   AGRATIO 1.6 04/02/2022   BILITOT 0.7 04/15/2023   ALKPHOS 62 04/15/2023   AST 28 04/15/2023   ALT 32 04/15/2023   Last lipids Lab Results  Component Value Date   CHOL 149 04/02/2022   HDL 38 (L) 04/02/2022   LDLCALC 95 04/02/2022   TRIG 82 04/02/2022   CHOLHDL 3.9 04/02/2022   Last thyroid functions Lab Results  Component Value Date   TSH 2.470 04/15/2023       Assessment & Plan:    Routine Health Maintenance and Physical Exam  Immunization History  Administered Date(s) Administered   Influenza, Seasonal, Injecte, Preservative Fre 04/15/2023   Influenza,inj,Quad PF,6+ Mos 03/31/2022    Health Maintenance  Topic Date Due   HIV Screening  Never done   DTaP/Tdap/Td (1 - Tdap) Never done   Medicare Annual Wellness (AWV)  05/12/2023   Cervical Cancer Screening (HPV/Pap Cotest)  02/20/2026   INFLUENZA VACCINE  Completed   Hepatitis C Screening  Completed   HPV VACCINES  Aged Out   COVID-19 Vaccine  Discontinued    Discussed  health benefits of physical activity, and encouraged her to engage in regular exercise appropriate for her age and condition.  Problem List Items Addressed This Visit       Migraine    Adequately controlled with as needed use of Imitrex.  Refill provided today.      Hypotension    Mildly hypotensive today with BP readings of 86/58 and 90/58.  Mentation intact.  She is asymptomatic. -Repeat labs ordered today -I have requested that she check her blood pressure daily at home for the next 2 weeks and document readings. -Nurse visit in 1-2 weeks for BP check      Eczema    Clobetasol cream refilled at patient's request      Need for immunization against influenza    Influenza vaccine administered today      Encounter for well adult exam with abnormal findings - Primary    Annual exam completed today.  Available records and labs reviewed. -Repeat labs ordered today -Flu shot administered -Medications refilled -Patient is mildly hypotensive.  I suspect this is her baseline.  Nurse visit scheduled for 1-2 weeks for BP check -Follow-up in 1 year for annual exam      Return in about 1 year (around 04/14/2024) for CPE.  Billie Lade, MD

## 2023-04-15 NOTE — Patient Instructions (Signed)
It was a pleasure to see you today.  Thank you for giving [place] the opportunity to be involved in your care.  Below is a brief recap of your visit and next steps.  We will plan to see you again in 1 year.  Summary Annual exam completed today We will repeat labs today Please check your blood pressure daily at home Nurse visit in 1-2 weeks for BP check 1 year follow up for annual exam

## 2023-04-17 LAB — CMP14+EGFR
ALT: 32 IU/L (ref 0–32)
AST: 28 IU/L (ref 0–40)
Albumin: 4.3 g/dL (ref 3.9–4.9)
Alkaline Phosphatase: 62 IU/L (ref 44–121)
BUN/Creatinine Ratio: 17 (ref 9–23)
BUN: 11 mg/dL (ref 6–20)
Bilirubin Total: 0.7 mg/dL (ref 0.0–1.2)
CO2: 26 mmol/L (ref 20–29)
Calcium: 9.5 mg/dL (ref 8.7–10.2)
Chloride: 103 mmol/L (ref 96–106)
Creatinine, Ser: 0.66 mg/dL (ref 0.57–1.00)
Globulin, Total: 2.1 g/dL (ref 1.5–4.5)
Glucose: 66 mg/dL — ABNORMAL LOW (ref 70–99)
Potassium: 4.3 mmol/L (ref 3.5–5.2)
Sodium: 142 mmol/L (ref 134–144)
Total Protein: 6.4 g/dL (ref 6.0–8.5)
eGFR: 119 mL/min/{1.73_m2} (ref >59)

## 2023-04-17 LAB — CBC WITH DIFFERENTIAL/PLATELET
Basophils Absolute: 0.1 10*3/uL (ref 0.0–0.2)
Basos: 2 %
EOS (ABSOLUTE): 0.2 10*3/uL (ref 0.0–0.4)
Eos: 4 %
Hematocrit: 39.5 % (ref 34.0–46.6)
Hemoglobin: 13.1 g/dL (ref 11.1–15.9)
Immature Grans (Abs): 0 10*3/uL (ref 0.0–0.1)
Immature Granulocytes: 0 %
Lymphocytes Absolute: 1.5 10*3/uL (ref 0.7–3.1)
Lymphs: 32 %
MCH: 32.8 pg (ref 26.6–33.0)
MCHC: 33.2 g/dL (ref 31.5–35.7)
MCV: 99 fL — ABNORMAL HIGH (ref 79–97)
Monocytes Absolute: 0.5 10*3/uL (ref 0.1–0.9)
Monocytes: 11 %
Neutrophils Absolute: 2.4 10*3/uL (ref 1.4–7.0)
Neutrophils: 51 %
Platelets: 375 10*3/uL (ref 150–450)
RBC: 3.99 x10E6/uL (ref 3.77–5.28)
RDW: 11.8 % (ref 11.7–15.4)
WBC: 4.6 10*3/uL (ref 3.4–10.8)

## 2023-04-17 LAB — HEMOGLOBIN A1C
Est. average glucose Bld gHb Est-mCnc: 97 mg/dL
Hgb A1c MFr Bld: 5 % (ref 4.8–5.6)

## 2023-04-17 LAB — TSH+FREE T4
Free T4: 1.53 ng/dL (ref 0.82–1.77)
TSH: 2.47 u[IU]/mL (ref 0.450–4.500)

## 2023-04-17 LAB — B12 AND FOLATE PANEL
Folate: >20 ng/mL (ref >3.0)
Vitamin B-12: 523 pg/mL (ref 232–1245)

## 2023-04-17 LAB — VITAMIN D 25 HYDROXY (VIT D DEFICIENCY, FRACTURES): Vit D, 25-Hydroxy: 61.7 ng/mL (ref 30.0–100.0)

## 2023-04-22 DIAGNOSIS — Z0001 Encounter for general adult medical examination with abnormal findings: Secondary | ICD-10-CM | POA: Insufficient documentation

## 2023-04-22 DIAGNOSIS — I959 Hypotension, unspecified: Secondary | ICD-10-CM | POA: Insufficient documentation

## 2023-04-22 NOTE — Assessment & Plan Note (Signed)
Clobetasol cream refilled at patient's request

## 2023-04-22 NOTE — Assessment & Plan Note (Signed)
Annual exam completed today.  Available records and labs reviewed. -Repeat labs ordered today -Flu shot administered -Medications refilled -Patient is mildly hypotensive.  I suspect this is her baseline.  Nurse visit scheduled for 1-2 weeks for BP check -Follow-up in 1 year for annual exam

## 2023-04-22 NOTE — Assessment & Plan Note (Signed)
Adequately controlled with as needed use of Imitrex.  Refill provided today.

## 2023-04-22 NOTE — Assessment & Plan Note (Signed)
Mildly hypotensive today with BP readings of 86/58 and 90/58.  Mentation intact.  She is asymptomatic. -Repeat labs ordered today -I have requested that she check her blood pressure daily at home for the next 2 weeks and document readings. -Nurse visit in 1-2 weeks for BP check

## 2023-04-22 NOTE — Assessment & Plan Note (Signed)
Influenza vaccine administered today.

## 2024-01-27 ENCOUNTER — Ambulatory Visit: Payer: Self-pay | Admitting: *Deleted

## 2024-01-27 ENCOUNTER — Encounter: Payer: Self-pay | Admitting: Family Medicine

## 2024-01-27 ENCOUNTER — Ambulatory Visit (INDEPENDENT_AMBULATORY_CARE_PROVIDER_SITE_OTHER): Admitting: Family Medicine

## 2024-01-27 VITALS — BP 95/64 | HR 92 | Ht 62.0 in | Wt 105.0 lb

## 2024-01-27 DIAGNOSIS — H109 Unspecified conjunctivitis: Secondary | ICD-10-CM | POA: Insufficient documentation

## 2024-01-27 DIAGNOSIS — L989 Disorder of the skin and subcutaneous tissue, unspecified: Secondary | ICD-10-CM | POA: Diagnosis not present

## 2024-01-27 DIAGNOSIS — H1089 Other conjunctivitis: Secondary | ICD-10-CM

## 2024-01-27 MED ORDER — POLYMYXIN B-TRIMETHOPRIM 10000-0.1 UNIT/ML-% OP SOLN
OPHTHALMIC | 0 refills | Status: AC
Start: 2024-01-27 — End: ?

## 2024-01-27 MED ORDER — MUPIROCIN 2 % EX OINT
1.0000 | TOPICAL_OINTMENT | Freq: Two times a day (BID) | CUTANEOUS | 0 refills | Status: AC
Start: 2024-01-27 — End: ?

## 2024-01-27 NOTE — Progress Notes (Signed)
 Acute Office Visit  Subjective:    Patient ID: Joanna Fisher, female    DOB: July 20, 1991, 33 y.o.   MRN: [id number]  Chief Complaint  Patient presents with   Medical Management of Chronic Issues    Pts mom states left eye is red, and swollen and painful    HPI The patient presents today accompanied by her mother with complaints of left eye redness, swelling, and pain. The mother reports that the symptoms began yesterday. The patient describes a gritty, burning sensation in the affected eye, along with crusting upon waking in the morning.Additionally, the mother reports that the patient has been picking at her left thumb, which appears red but with no swelling or warmth noted.    Past Medical History:  Diagnosis Date   Allergic rhinitis    ASCUS of cervix with negative high risk HPV 04/08/2017   Repeat in 1 year    Autistic disorder    CP (cerebral palsy), hypotonic (HCC)    Eczema    Hypotonia    Migraine 09/18/2015   MRSA (methicillin resistant Staphylococcus aureus) 07/17/2014    Past Surgical History:  Procedure Laterality Date   NONE TO DATE     As of 12/14/17    Family History  Problem Relation Age of Onset   Heart disease Other    Arthritis Other    Cancer Other    Asthma Other    Diabetes Other    Dementia Paternal Grandfather    Heart failure Paternal Grandfather    Diabetes Paternal Grandmother    Hypertension Paternal Grandmother    Fibromyalgia Paternal Grandmother    COPD Paternal Grandmother    Heart disease Maternal Grandmother    Heart disease Maternal Grandfather    Liver cancer Maternal Grandfather        liver   Hypertension Father    Hypertension Mother    Heart attack Mother    Other Mother        Hemachromatosis carrier   Hypertension Brother    Hyperlipidemia Brother     Social History   Socioeconomic History   Marital status: Single    Spouse name: Not on file   Number of children: Not on file   Years of education: 12th  grade   Highest education level: Not on file  Occupational History   Occupation: na    Associate Professor: not employed  Tobacco Use   Smoking status: Never   Smokeless tobacco: Never  Substance and Sexual Activity   Alcohol use: No   Drug use: No   Sexual activity: Never  Other Topics Concern   Not on file  Social History Narrative   Lives with her mother    Social Drivers of Health   Financial Resource Strain: Low Risk  (04/24/2021)   Overall Financial Resource Strain (CARDIA)    Difficulty of Paying Living Expenses: Not hard at all  Food Insecurity: No Food Insecurity (04/24/2021)   Hunger Vital Sign    Worried About Running Out of Food in the Last Year: Never true    Ran Out of Food in the Last Year: Never true  Transportation Needs: No Transportation Needs (04/24/2021)   PRAPARE - Administrator, Civil Service (Medical): No    Lack of Transportation (Non-Medical): No  Physical Activity: Inactive (04/24/2021)   Exercise Vital Sign    Days of Exercise per Week: 0 days    Minutes of Exercise per Session: 0 min  Stress: No  Stress Concern Present (04/24/2021)   [employer] of Occupational Health - Occupational Stress Questionnaire    Feeling of Stress : Not at all  Social Connections: Socially Isolated (04/24/2021)   Social Connection and Isolation Panel    Frequency of Communication with Friends and Family: Never    Frequency of Social Gatherings with Friends and Family: Never    Attends Religious Services: More than 4 times per year    Active Member of [employer] or Organizations: No    Attends Banker Meetings: Never    Marital Status: Never married  Intimate Partner Violence: Not At Risk (04/24/2021)   Humiliation, Afraid, Rape, and Kick questionnaire    Fear of Current or Ex-Partner: No    Emotionally Abused: No    Physically Abused: No    Sexually Abused: No    Outpatient Medications Prior to Visit  Medication Sig Dispense Refill   Bioflavonoid  Products (VITAMIN C) CHEW Chew by mouth. Once a day     CALCIUM-VITAMIN D  PO Take by mouth.     Clobetasol  Prop Emollient Base (CLOBETASOL  PROPIONATE E) 0.05 % emollient cream Apply to affected area qd- not for face, fold or groin 30 g 6   diphenhydrAMINE (BENADRYL) 50 MG capsule Take 50 mg by mouth once as needed.     METAMUCIL FIBER PO Take by mouth. Once a day     Probiotic Product (PROBIOTIC PO) Take by mouth. Once a day     SUMAtriptan  (IMITREX ) 50 MG tablet Take 1 tablet (50 mg total) by mouth every 2 (two) hours as needed for migraine. May repeat in 2 hours if headache persists or recurs. 10 tablet 2   Multiple Vitamin (MULTIVITAMIN) tablet Take 1 tablet by mouth daily.     No facility-administered medications prior to visit.    Allergies  Allergen Reactions   Dust Mite Extract Other (See Comments)    Running nose congestion     Review of Systems  Constitutional:  Negative for chills and fever.  HENT:  Positive for ear discharge and ear pain.        Left eye swelling and redness  Eyes:  Negative for visual disturbance.  Respiratory:  Negative for chest tightness and shortness of breath.   Neurological:  Negative for dizziness and headaches.       Objective:    Physical Exam HENT:     Head: Normocephalic.     Mouth/Throat:     Mouth: Mucous membranes are moist.  Eyes:     General:        Left eye: Discharge present.    Conjunctiva/sclera:     Left eye: Left conjunctiva is injected.     Comments: Lid swelling  Cardiovascular:     Rate and Rhythm: Normal rate.     Heart sounds: Normal heart sounds.  Pulmonary:     Effort: Pulmonary effort is normal.     Breath sounds: Normal breath sounds.  Neurological:     Mental Status: She is alert.     BP 95/64   Pulse 92   Ht 5' 2 (1.575 m)   Wt 105 lb 0.6 oz (47.6 kg)   SpO2 96%   BMI 19.21 kg/m  Wt Readings from Last 3 Encounters:  01/27/24 105 lb 0.6 oz (47.6 kg)  04/15/23 105 lb 3.2 oz (47.7 kg)  03/31/22  106 lb (48.1 kg)       Assessment & Plan:  Other conjunctivitis of left eye Assessment &  Plan: Will intititate therapy on Polytrim ophthalmic solution 1-2 drops 4 times daily  Discussed Nonpharmacologic Measures: -Apply warm compresses to the affected eye 3-4 times daily. -Avoid touching or rubbing the eye. -Maintain good hand hygiene: -Wash hands frequently with soap and water for at least 20 seconds, especially before touching your face or eyes. -Use paper towels to dry hands. -Do not share towels or washcloths. -Avoid driving or operating heavy machinery if vision is blurred.  Referral to ophthalmology will be considered if: -Visual changes occur -Pain becomes severe or worsens -No improvement is seen within 48-72 hours  Orders: -     Polymyxin B-Trimethoprim; Apply 1 to 2 drops 4 times daily for 5 to 7 days  Dispense: 10 mL; Refill: 0  Skin lesion Assessment & Plan: Will initiate therapy with mupirocin  ointment applied twice daily. The patient is encouraged to monitor for signs of infection, such as increased redness, warmth, swelling, pain, drainage, or fever. Follow up if symptoms worsen or do not improve within a few days.   Orders: -     Mupirocin ; Apply 1 Application topically 2 (two) times daily.  Dispense: 22 g; Refill: 0  Note: This chart has been completed using Engineer, civil (consulting) software, and while attempts have been made to ensure accuracy, certain words and phrases may not be transcribed as intended.    Eladia Frame, FNP

## 2024-01-27 NOTE — Telephone Encounter (Signed)
 Patient scheduled.

## 2024-01-27 NOTE — Assessment & Plan Note (Signed)
 Will initiate therapy with mupirocin  ointment applied twice daily. The patient is encouraged to monitor for signs of infection, such as increased redness, warmth, swelling, pain, drainage, or fever. Follow up if symptoms worsen or do not improve within a few days.

## 2024-01-27 NOTE — Patient Instructions (Addendum)
 I appreciate the opportunity to provide care to you today!    Conjunctivitis - Left Eye: -Start applying ophthalmic solution (1-2 drops) to the left eye four times daily. Nonpharmacologic Measures: -Apply warm compresses to the affected eye 3-4 times daily. -Avoid touching or rubbing the eye. -Maintain good hand hygiene: -Wash hands frequently with soap and water for at least 20 seconds, especially before touching your face or eyes. -Use paper towels to dry hands. -Do not share towels or washcloths. -Avoid driving or operating heavy machinery if vision is blurred.  Referral to ophthalmology will be considered if: -Visual changes occur -Pain becomes severe or worsens -No improvement is seen within 48-72 hours  Please follow up if your symptoms worsen or fail to improve.  Please continue to a heart-healthy diet and increase your physical activities. Try to exercise for at least five days a week.    It was a pleasure to see you and I look forward to continuing to work together on your health and well-being. Please do not hesitate to call the office if you need care or have questions about your care.  In case of emergency, please visit the Emergency Department for urgent care, or contact our clinic at [phone] to schedule an appointment. We're here to help you!   Have a wonderful day and week. With Gratitude, Hebert Dooling MSN, FNP-BC

## 2024-01-27 NOTE — Telephone Encounter (Signed)
 Copied from CRM [id number]. Topic: Clinical - Red Word Triage >> Jan 27, 2024  8:14 AM Charlet HERO wrote: Red Word that prompted transfer to Nurse Triage: Patient mother is calling she is stating that her eye is red and swollen and looks gooey she can hardly open it and it hurts. Reason for Disposition  [1] SEVERE eyelid swelling (i.e., shut or almost) AND [2] involves both eyes AND [3] itchy  Answer Assessment - Initial Assessment Questions 1. ONSET: When did the swelling start? (e.g., minutes, hours, days)     Inocente, mother calling in.  Pt is having redness and swelling of her eye.   It's gooey.  She is autistic.  She stays in her room a lot.  She rubs her eyes a lot.    Yesterday I noticed her eye.   I'm not sure how long her eye has been bothering her.     2. LOCATION: What part of the eyelids is swollen?    It's her left eye.    3. SEVERITY: How swollen is it?     She can hardly open it and it hurts. 4. ITCHING: Is there any itching? If Yes, ask: How much?   (Scale 1-10; mild, moderate or severe)     She rubs her eyes a lot.    5. PAIN: Is the swelling painful to touch? If Yes, ask: How painful is it?   (Scale 1-10; mild, moderate or severe)     This morning she is c/o her eye hurting.   She is autistic so she doesn't communicate very well.   I have to ask her many questions to get an answer. 6. FEVER: Do you have a fever? If Yes, ask: What is it, how was it measured, and when did it start?      Not sure 7. CAUSE: What do you think is causing the swelling?     I'm not sure.    I know she rubs her eyes a lot.   8. RECURRENT SYMPTOM: Have you had eyelid swelling before? If Yes, ask: When was the last time? What happened that time?     Yes she has had this before.   She is like a 12 yr. Old at itmes. 9. OTHER SYMPTOMS: Do you have any other symptoms? (e.g., blurred vision, eye discharge, rash, runny nose)     See above 10. PREGNANCY: Is there any chance you are  pregnant? When was your last menstrual period?       Not asked  Protocols used: Eye - Swelling-A-AH FYI Only or Action Required?: FYI only for provider.  Patient was last seen in primary care on 04/15/2023 by Melvenia Manus BRAVO, MD. Called Nurse Triage reporting Eye Pain. Symptoms began yesterday. Interventions attempted: Ice/heat application. Symptoms are: gradually worsening. Left eye is red, swollen, and painful.  Triage Disposition: See Physician Within 24 Hours  Patient/caregiver understands and will follow disposition?: Yes

## 2024-01-27 NOTE — Assessment & Plan Note (Signed)
 Will intititate therapy on Polytrim ophthalmic solution 1-2 drops 4 times daily  Discussed Nonpharmacologic Measures: -Apply warm compresses to the affected eye 3-4 times daily. -Avoid touching or rubbing the eye. -Maintain good hand hygiene: -Wash hands frequently with soap and water for at least 20 seconds, especially before touching your face or eyes. -Use paper towels to dry hands. -Do not share towels or washcloths. -Avoid driving or operating heavy machinery if vision is blurred.  Referral to ophthalmology will be considered if: -Visual changes occur -Pain becomes severe or worsens -No improvement is seen within 48-72 hours

## 2024-02-08 ENCOUNTER — Ambulatory Visit: Payer: Self-pay | Admitting: *Deleted

## 2024-02-08 NOTE — Telephone Encounter (Signed)
 Patient scheduled.

## 2024-02-08 NOTE — Telephone Encounter (Signed)
 FYI Only or Action Required?: Action required by provider: request for appointment.  Patient was last seen in primary care on 01/27/2024 by Zarwolo, Gloria, FNP.  Called Nurse Triage reporting Rash.  Symptoms began today.  Interventions attempted: clobetasol  Prop Emollient Base (CLOBETASOL  PROPIONATE E) 0.05 % emollient cream .  Symptoms are: gradually worsening.  Triage Disposition: See Physician Within 24 Hours  Patient/caregiver understands and will follow disposition?: Yes                Copied from CRM [id number]. Topic: Clinical - Red Word Triage >> Feb 08, 2024  3:16 PM Larissa S wrote: [facility] that prompted transfer to Nurse Triage: Rash with drainage and redness- Rt ear Reason for Disposition  [1] Looks infected (e.g., spreading redness, pus) AND [2] no fever  Answer Assessment - Initial Assessment Questions Earliest appt scheduled with other provider for 02/10/24. Patient's mother on DPR reports patient thumb and pink eye now cleared up and now has rash and patient can not stop scratching hx autism.     1. CALLER DIAGNOSIS: What do you think is causing the rash? (e.g., athlete's foot, chickenpox, hives, impetigo)     Eczema per patient mother 2. LOCALIZED OR WIDESPREAD:  Is the rash all over (widespread) or mostly just in one area of the body (localized)?      Mostly behind right ear 3. NEW MEDICINES: Are you taking any new medicine?     Na  4. APPEARANCE of RASH: What does the rash look like? What color is it?  Note: It is difficult to assess rash color in people with darker-colored skin. When this situation occurs, simply ask the caller to describe what they see.     Red, raw, bleeding at times, oozing white pus 5. FEVER: Do you have a fever? If Yes, ask: What is your temperature, how was it measured, and when did it start?     na 6. PREGNANCY: Is there any chance you are pregnant? When was your last menstrual period?     na  Answer  Assessment - Initial Assessment Questions 1. LOCATION: Where is the wound located?      Behind right ear 2. WOUND APPEARANCE: What does the wound look like?      Red, raw, oozing white pus, bleeding at times.  3. SIZE: If redness is present, ask: What is the size of the red area? (Inches, centimeters, or compare to size of a coin)      Na  4. SPREAD: What's changed in the last day?  Do you see any red streaks coming from the wound?     Worsening drainage 5. ONSET: When did it start to look infected?      Today  6. MECHANISM: How did the wound start, what was the cause?     Patient scratching  7. PAIN: Do you have any pain?  If Yes, ask: How bad is the pain?  (e.g., Scale 1-10; mild, moderate, or severe)     Unknown but patient reports it hurts 8. FEVER: Do you have a fever? If Yes, ask: What is your temperature, how was it measured, and when did it start?     No  9. OTHER SYMPTOMS: Do you have any other symptoms? (e.g., shaking chills, weakness, rash elsewhere on body)     No other sx  10. PREGNANCY: Is there any chance you are pregnant? When was your last menstrual period?       Na  Protocols  used: Rash - Guideline Selection-A-AH, Wound Infection Suspected-A-AH

## 2024-02-10 ENCOUNTER — Ambulatory Visit: Payer: Self-pay | Admitting: Family Medicine

## 2024-02-10 ENCOUNTER — Encounter: Payer: Self-pay | Admitting: Family Medicine

## 2024-02-10 VITALS — BP 94/64 | HR 90 | Ht 62.0 in | Wt 104.0 lb

## 2024-02-10 DIAGNOSIS — L089 Local infection of the skin and subcutaneous tissue, unspecified: Secondary | ICD-10-CM | POA: Diagnosis not present

## 2024-02-10 MED ORDER — DOXYCYCLINE HYCLATE 100 MG PO TABS
100.0000 mg | ORAL_TABLET | Freq: Two times a day (BID) | ORAL | 0 refills | Status: AC
Start: 2024-02-10 — End: 2024-02-17

## 2024-02-10 NOTE — Assessment & Plan Note (Signed)
 The rash is localized to the right ear. Initiating treatment with Doxycycline  100 mg twice daily for 7 days. Advise keep the affected area clean and dry, and avoid scratching to prevent further irritation or infection. Use a gentle moisturizer or emollient to soothe dryness and support skin healing

## 2024-02-10 NOTE — Patient Instructions (Signed)

## 2024-02-10 NOTE — Progress Notes (Signed)
 Established Patient Office Visit   Subjective  Patient ID: Joanna Fisher, female    DOB: September 13, 1990  Age: 33 y.o. MRN: [id number]  Chief Complaint  Patient presents with   Rash    Rash behind ear, started on the 9th, used the antibiotic cream. On the 15th it got worse, started oozing pus and blood     She  has a past medical history of Allergic rhinitis, ASCUS of cervix with negative high risk HPV (04/08/2017), Autistic disorder, CP (cerebral palsy), hypotonic (HCC), Eczema, Hypotonia, Migraine (09/18/2015), and MRSA (methicillin resistant Staphylococcus aureus) (07/17/2014).  The patient presents with a recurrent rash on the right ear that initially began on the 9th and flared up again the follow days. She reports that the rash has gradually worsened over time. It is characterized by blistering, redness, peeling, itchiness, and dryness. There is no known exposure to any specific irritants or allergens. The patient denies any associated symptoms such as facial swelling, fever, fatigue, or shortness of breath. Her past medical history is notable for eczema and environmental allergies. Previous treatments have included antibiotic cream, moisturizers, and topical steroids, which provided only mild and temporary relief.    Review of Systems  Constitutional:  Negative for fever.  Respiratory:  Negative for shortness of breath.   Cardiovascular:  Negative for chest pain.  Skin:  Positive for itching and rash.  Neurological:  Negative for headaches.      Objective:     BP 94/64   Pulse 90   Ht 5' 2 (1.575 m)   Wt 104 lb (47.2 kg)   SpO2 97%   BMI 19.02 kg/m  BP Readings from Last 3 Encounters:  02/10/24 94/64  01/27/24 95/64  04/15/23 (!) 90/58      Physical Exam Vitals reviewed.  Constitutional:      General: She is not in acute distress.    Appearance: Normal appearance. She is not ill-appearing, toxic-appearing or diaphoretic.  HENT:     Head: Normocephalic.   Eyes:     General:        Right eye: No discharge.        Left eye: No discharge.     Conjunctiva/sclera: Conjunctivae normal.  Cardiovascular:     Rate and Rhythm: Normal rate.     Pulses: Normal pulses.     Heart sounds: Normal heart sounds.  Pulmonary:     Effort: Pulmonary effort is normal. No respiratory distress.     Breath sounds: Normal breath sounds.  Skin:    General: Skin is warm and dry.     Capillary Refill: Capillary refill takes less than 2 seconds.     Findings: Erythema, lesion and rash present.     Comments: Posterior right ear  Neurological:     Mental Status: She is alert.  Psychiatric:        Mood and Affect: Mood normal.      No results found for any visits on 02/10/24.  The ASCVD Risk score (Arnett DK, et al., 2019) failed to calculate for the following reasons:   The 2019 ASCVD risk score is only valid for ages 41 to 33    Assessment & Plan:  Skin infection Assessment & Plan: The rash is localized to the right ear. Initiating treatment with Doxycycline  100 mg twice daily for 7 days. Advise keep the affected area clean and dry, and avoid scratching to prevent further irritation or infection. Use a gentle moisturizer or emollient to soothe dryness  and support skin healing  Orders: -     Doxycycline  Hyclate; Take 1 tablet (100 mg total) by mouth 2 (two) times daily for 7 days.  Dispense: 14 tablet; Refill: 0    Return if symptoms worsen or fail to improve.   Hilario Kidd Wilhelmena Falter, FNP

## 2024-03-09 ENCOUNTER — Ambulatory Visit

## 2024-04-17 ENCOUNTER — Ambulatory Visit (INDEPENDENT_AMBULATORY_CARE_PROVIDER_SITE_OTHER): Admitting: Internal Medicine

## 2024-04-17 ENCOUNTER — Encounter: Payer: Self-pay | Admitting: Internal Medicine

## 2024-04-17 VITALS — BP 90/68 | HR 90 | Ht 63.0 in | Wt 104.0 lb

## 2024-04-17 DIAGNOSIS — L03811 Cellulitis of head [any part, except face]: Secondary | ICD-10-CM | POA: Insufficient documentation

## 2024-04-17 DIAGNOSIS — L309 Dermatitis, unspecified: Secondary | ICD-10-CM | POA: Diagnosis not present

## 2024-04-17 MED ORDER — FLUOCINOLONE ACETONIDE SCALP 0.01 % EX OIL: 1.0000 | TOPICAL_OIL | Freq: Every day | CUTANEOUS | 1 refills | Status: AC

## 2024-04-17 MED ORDER — DOXYCYCLINE HYCLATE 100 MG PO TABS: 100.0000 mg | ORAL_TABLET | Freq: Two times a day (BID) | ORAL | 0 refills | Status: AC

## 2024-04-17 NOTE — Assessment & Plan Note (Signed)
 Has chronic itching rash behind right ear Has generalized itching over extremities as well Has tried clobetasol  emollient Prescribed fluocinolone  scalp oil to be used behind right ear Offered BH therapy for constant itching, but mother is hesitant about it Referred to allergy clinic for allergy testing due to chronic eczema

## 2024-04-17 NOTE — Progress Notes (Signed)
 Acute Office Visit  Subjective:    Patient ID: GEMMA RUAN, female    DOB: December 30, 1990, 33 y.o.   MRN: [id number]  Chief Complaint  Patient presents with   Skin Problem    Reports ear infection from eczema ongoing for 2 months. Has been using neosporin pain relief at home but has not helped much.     HPI Patient is in today for evaluation for intense itching rash behind her right ear, which is chronic.  She has had bleeding leading to scabs due to itching.  She has a history of eczema, reported by her mother.  She has used clobetasol  emollient cream, which transiently improves her rash, but later she starts itching again.  She also has itching over upper and lower extremities at times.  Of note, she has history of cerebral palsy and autism.  Her mother tries to keep her from scratching, but has had difficulty to prevent her from doing it at times.  They have 2 dogs at home, and she likes to pet them at times.  Past Medical History:  Diagnosis Date   Allergic rhinitis    ASCUS of cervix with negative high risk HPV 04/08/2017   Repeat in 1 year    Autistic disorder    CP (cerebral palsy), hypotonic (HCC)    Eczema    Hypotonia    Migraine 09/18/2015   MRSA (methicillin resistant Staphylococcus aureus) 07/17/2014    Past Surgical History:  Procedure Laterality Date   NONE TO DATE     As of 12/14/17    Family History  Problem Relation Age of Onset   Heart disease Other    Arthritis Other    Cancer Other    Asthma Other    Diabetes Other    Dementia Paternal Grandfather    Heart failure Paternal Grandfather    Diabetes Paternal Grandmother    Hypertension Paternal Grandmother    Fibromyalgia Paternal Grandmother    COPD Paternal Grandmother    Heart disease Maternal Grandmother    Heart disease Maternal Grandfather    Liver cancer Maternal Grandfather        liver   Hypertension Father    Hypertension Mother    Heart attack Mother    Other Mother         Hemachromatosis carrier   Hypertension Brother    Hyperlipidemia Brother     Social History   Socioeconomic History   Marital status: Single    Spouse name: Not on file   Number of children: Not on file   Years of education: 12th grade   Highest education level: Not on file  Occupational History   Occupation: na    Associate Professor: not employed  Tobacco Use   Smoking status: Never   Smokeless tobacco: Never  Substance and Sexual Activity   Alcohol use: No   Drug use: No   Sexual activity: Never  Other Topics Concern   Not on file  Social History Narrative   Lives with her mother    Social Drivers of Health   Financial Resource Strain: Low Risk  (04/24/2021)   Overall Financial Resource Strain (CARDIA)    Difficulty of Paying Living Expenses: Not hard at all  Food Insecurity: No Food Insecurity (04/24/2021)   Hunger Vital Sign    Worried About Running Out of Food in the Last Year: Never true    Ran Out of Food in the Last Year: Never true  Transportation Needs: No Transportation  Needs (04/24/2021)   PRAPARE - Administrator, Civil Service (Medical): No    Lack of Transportation (Non-Medical): No  Physical Activity: Inactive (04/24/2021)   Exercise Vital Sign    Days of Exercise per Week: 0 days    Minutes of Exercise per Session: 0 min  Stress: No Stress Concern Present (04/24/2021)   [employer] of Occupational Health - Occupational Stress Questionnaire    Feeling of Stress : Not at all  Social Connections: Socially Isolated (04/24/2021)   Social Connection and Isolation Panel    Frequency of Communication with Friends and Family: Never    Frequency of Social Gatherings with Friends and Family: Never    Attends Religious Services: More than 4 times per year    Active Member of [employer] or Organizations: No    Attends Banker Meetings: Never    Marital Status: Never married  Intimate Partner Violence: Not At Risk (04/24/2021)   Humiliation, Afraid,  Rape, and Kick questionnaire    Fear of Current or Ex-Partner: No    Emotionally Abused: No    Physically Abused: No    Sexually Abused: No    Outpatient Medications Prior to Visit  Medication Sig Dispense Refill   Bioflavonoid Products (VITAMIN C) CHEW Chew by mouth. Once a day     CALCIUM-VITAMIN D  PO Take by mouth.     Clobetasol  Prop Emollient Base (CLOBETASOL  PROPIONATE E) 0.05 % emollient cream Apply to affected area qd- not for face, fold or groin 30 g 6   diphenhydrAMINE (BENADRYL) 50 MG capsule Take 50 mg by mouth once as needed.     METAMUCIL FIBER PO Take by mouth. Once a day     Multiple Vitamin (MULTIVITAMIN) tablet Take 1 tablet by mouth daily.     mupirocin  ointment (BACTROBAN ) 2 % Apply 1 Application topically 2 (two) times daily. 22 g 0   Probiotic Product (PROBIOTIC PO) Take by mouth. Once a day     SUMAtriptan  (IMITREX ) 50 MG tablet Take 1 tablet (50 mg total) by mouth every 2 (two) hours as needed for migraine. May repeat in 2 hours if headache persists or recurs. 10 tablet 2   trimethoprim -polymyxin b  (POLYTRIM ) ophthalmic solution Apply 1 to 2 drops 4 times daily for 5 to 7 days 10 mL 0   No facility-administered medications prior to visit.    Allergies  Allergen Reactions   Dust Mite Extract Other (See Comments)    Running nose congestion     Review of Systems  Constitutional:  Negative for chills and fever.  HENT:  Negative for congestion and sore throat.   Eyes:  Negative for pain and discharge.  Respiratory:  Negative for cough and shortness of breath.   Cardiovascular:  Negative for chest pain and palpitations.  Gastrointestinal:  Negative for abdominal pain, diarrhea, nausea and vomiting.  Endocrine: Negative for polydipsia and polyuria.  Genitourinary:  Negative for dysuria and hematuria.  Musculoskeletal:  Negative for neck pain and neck stiffness.  Skin:  Positive for rash.  Neurological:  Negative for dizziness and weakness.   Psychiatric/Behavioral:  Negative for agitation and behavioral problems.        Objective:    Physical Exam Vitals reviewed.  Constitutional:      General: She is not in acute distress.    Appearance: She is not diaphoretic.  HENT:     Head: Normocephalic and atraumatic.     Nose: Nose normal.     Mouth/Throat:  Mouth: Mucous membranes are moist.  Eyes:     General: No scleral icterus.    Extraocular Movements: Extraocular movements intact.  Cardiovascular:     Rate and Rhythm: Normal rate and regular rhythm.     Heart sounds: Normal heart sounds. No murmur heard. Pulmonary:     Breath sounds: Normal breath sounds. No wheezing or rales.  Abdominal:     Palpations: Abdomen is soft.     Tenderness: There is no abdominal tenderness.  Musculoskeletal:     Cervical back: Neck supple. No tenderness.     Right lower leg: No edema.     Left lower leg: No edema.  Skin:    General: Skin is warm.     Findings: Rash (Erythematous patches behind right ear) present.  Neurological:     General: No focal deficit present.     Mental Status: She is alert. Mental status is at baseline.  Psychiatric:        Mood and Affect: Mood normal.        Behavior: Behavior normal.     BP 90/68   Pulse 90   Ht 5' 3 (1.6 m)   Wt 104 lb (47.2 kg)   SpO2 97%   BMI 18.42 kg/m  Wt Readings from Last 3 Encounters:  04/17/24 104 lb (47.2 kg)  02/10/24 104 lb (47.2 kg)  01/27/24 105 lb 0.6 oz (47.6 kg)        Assessment & Plan:   Problem List Items Addressed This Visit       Musculoskeletal and Integument   Eczema - Primary   Has chronic itching rash behind right ear Has generalized itching over extremities as well Has tried clobetasol  emollient Prescribed fluocinolone  scalp oil to be used behind right ear Offered BH therapy for constant itching, but mother is hesitant about it Referred to allergy clinic for allergy testing due to chronic eczema      Relevant Medications    Fluocinolone  Acetonide Scalp 0.01 % OIL   Other Relevant Orders   Ambulatory referral to Allergy     Other   Cellulitis of head except face   Has chronic erythematous patch behind right ear with mild edema Started empiric doxycycline       Relevant Medications   doxycycline  (VIBRA -TABS) 100 MG tablet     Meds ordered this encounter  Medications   Fluocinolone  Acetonide Scalp 0.01 % OIL    Sig: Apply 1 Application topically daily.    Dispense:  118 mL    Refill:  1   doxycycline  (VIBRA -TABS) 100 MG tablet    Sig: Take 1 tablet (100 mg total) by mouth 2 (two) times daily.    Dispense:  28 tablet    Refill:  0     Farron Lafond MARLA Blanch, MD

## 2024-04-17 NOTE — Patient Instructions (Signed)
 Please start taking Doxycyline as prescribed.  Please use Fluocinolone  oil as prescribed for eczema.

## 2024-04-17 NOTE — Assessment & Plan Note (Signed)
 Has chronic erythematous patch behind right ear with mild edema Started empiric doxycycline 

## 2024-04-20 ENCOUNTER — Encounter: Payer: Medicare Other | Admitting: Internal Medicine

## 2024-04-20 ENCOUNTER — Encounter: Admitting: Family Medicine

## 2024-04-20 ENCOUNTER — Ambulatory Visit (INDEPENDENT_AMBULATORY_CARE_PROVIDER_SITE_OTHER)

## 2024-04-20 VITALS — BP 90/68 | Ht 63.0 in | Wt 104.0 lb

## 2024-04-20 DIAGNOSIS — Z0000 Encounter for general adult medical examination without abnormal findings: Secondary | ICD-10-CM

## 2024-04-20 NOTE — Patient Instructions (Signed)
 Joanna Fisher,  Thank you for taking the time for your Medicare Wellness Visit. I appreciate your continued commitment to your health goals. Please review the care plan we discussed, and feel free to reach out if I can assist you further.  Medicare recommends these wellness visits once per year to help you and your care team stay ahead of potential health issues. These visits are designed to focus on prevention, allowing your provider to concentrate on managing your acute and chronic conditions during your regular appointments.  Please note that Annual Wellness Visits do not include a physical exam. Some assessments may be limited, especially if the visit was conducted virtually. If needed, we may recommend a separate in-person follow-up with your provider.  No Referrals were placed during today's visit.   Wishing you excellent health and many blessings in the year to come!  -Nino Amano, CMA  Ongoing Care Seeing your primary care provider every 3 to 6 months helps us  monitor your health and provide consistent, personalized care.   Recommended Screenings:  Health Maintenance  Topic Date Due   HIV Screening  Never done   DTaP/Tdap/Td vaccine (1 - Tdap) Never done   Hepatitis B Vaccine (1 of 3 - 19+ 3-dose series) Never done   HPV Vaccine (1 - 3-dose SCDM series) Never done   Flu Shot  02/25/2024   Medicare Annual Wellness Visit  04/20/2025   Pap with HPV screening  02/20/2026   Hepatitis C Screening  Completed   Pneumococcal Vaccine  Aged Out   Meningitis B Vaccine  Aged Out   COVID-19 Vaccine  Discontinued       04/20/2024    2:15 PM  Advanced Directives  Does Patient Have a Medical Advance Directive? No  Would patient like information on creating a medical advance directive? No - Patient declined   Advance Care Planning is important because it: Ensures you receive medical care that aligns with your values, goals, and preferences. Provides guidance to your family and loved ones,  reducing the emotional burden of decision-making during critical moments.  Vision: Annual vision screenings are recommended for early detection of glaucoma, cataracts, and diabetic retinopathy. These exams can also reveal signs of chronic conditions such as diabetes and high blood pressure.  Dental: Annual dental screenings help detect early signs of oral cancer, gum disease, and other conditions linked to overall health, including heart disease and diabetes.  Please see the attached documents for additional preventive care recommendations.

## 2024-04-20 NOTE — Progress Notes (Signed)
 Please attest and cosign this visit due to patients primary care provider not being immediately available at the time the visit was completed.   Subjective:   Joanna Fisher is a 33 y.o. who presents for a Medicare Wellness preventive visit. As a reminder, Annual Wellness Visits don't include a physical exam, and some assessments may be limited, especially if this visit is performed virtually. We may recommend an in-person follow-up visit with your provider if needed.  Visit Complete: Virtual I connected with  Joanna Fisher on 04/20/24 by a audio enabled telemedicine application and verified that I am speaking with the correct person using two identifiers.  Patient Location: Home  Provider Location: Home Office  I discussed the limitations of evaluation and management by telemedicine. The patient expressed understanding and agreed to proceed.  Vital Signs: Because this visit was a virtual/telehealth visit, some criteria may be missing or patient reported. Any vitals not documented were not able to be obtained and vitals that have been documented are patient reported.  Persons Participating in Visit: Patient assisted by Joanna Fisher (mother).  AWV Questionnaire: No: Patient Medicare AWV questionnaire was not completed prior to this visit. Cardiac Risk Factors include: none     Objective:    Today's Vitals   04/20/24 1416  BP: 90/68  Weight: 104 lb (47.2 kg)  Height: 5' 3 (1.6 m)  PainSc: 5   PainLoc: Ear   Body mass index is 18.42 kg/m.    04/20/2024    2:15 PM 04/24/2021    2:32 PM 04/02/2017   10:38 AM  Advanced Directives  Does Patient Have a Medical Advance Directive? No No No   Would patient like information on creating a medical advance directive? No - Patient declined  Yes (MAU/Ambulatory/Procedural Areas - Information given)      Data saved with a previous flowsheet row definition    Current Medications (verified) Outpatient Encounter Medications as of  04/20/2024  Medication Sig   Bioflavonoid Products (VITAMIN C) CHEW Chew by mouth. Once a day   CALCIUM-VITAMIN D  PO Take by mouth.   Clobetasol  Prop Emollient Base (CLOBETASOL  PROPIONATE E) 0.05 % emollient cream Apply to affected area qd- not for face, fold or groin   diphenhydrAMINE (BENADRYL) 50 MG capsule Take 50 mg by mouth once as needed.   doxycycline  (VIBRA -TABS) 100 MG tablet Take 1 tablet (100 mg total) by mouth 2 (two) times daily.   Fluocinolone  Acetonide Scalp 0.01 % OIL Apply 1 Application topically daily.   METAMUCIL FIBER PO Take by mouth. Once a day   Multiple Vitamin (MULTIVITAMIN) tablet Take 1 tablet by mouth daily.   mupirocin  ointment (BACTROBAN ) 2 % Apply 1 Application topically 2 (two) times daily.   Probiotic Product (PROBIOTIC PO) Take by mouth. Once a day   SUMAtriptan  (IMITREX ) 50 MG tablet Take 1 tablet (50 mg total) by mouth every 2 (two) hours as needed for migraine. May repeat in 2 hours if headache persists or recurs.   trimethoprim -polymyxin b  (POLYTRIM ) ophthalmic solution Apply 1 to 2 drops 4 times daily for 5 to 7 days   No facility-administered encounter medications on file as of 04/20/2024.    Allergies (verified) Dust mite extract   History: Past Medical History:  Diagnosis Date   Allergic rhinitis    ASCUS of cervix with negative high risk HPV 04/08/2017   Repeat in 1 year    Autistic disorder    CP (cerebral palsy), hypotonic (HCC)    Eczema  Hypotonia    Migraine 09/18/2015   MRSA (methicillin resistant Staphylococcus aureus) 07/17/2014   Past Surgical History:  Procedure Laterality Date   NONE TO DATE     As of 12/14/17   Family History  Problem Relation Age of Onset   Heart disease Other    Arthritis Other    Cancer Other    Asthma Other    Diabetes Other    Dementia Paternal Grandfather    Heart failure Paternal Grandfather    Diabetes Paternal Grandmother    Hypertension Paternal Grandmother    Fibromyalgia Paternal  Grandmother    COPD Paternal Grandmother    Heart disease Maternal Grandmother    Heart disease Maternal Grandfather    Liver cancer Maternal Grandfather        liver   Hypertension Father    Hypertension Mother    Heart attack Mother    Other Mother        Hemachromatosis carrier   Hypertension Brother    Hyperlipidemia Brother    Social History   Socioeconomic History   Marital status: Single    Spouse name: Not on file   Number of children: Not on file   Years of education: 12th grade   Highest education level: Not on file  Occupational History   Occupation: na    Associate Professor: not employed  Tobacco Use   Smoking status: Never   Smokeless tobacco: Never  Substance and Sexual Activity   Alcohol use: No   Drug use: No   Sexual activity: Never  Other Topics Concern   Not on file  Social History Narrative   Lives with her mother    Social Drivers of Health   Financial Resource Strain: Low Risk  (04/20/2024)   Overall Financial Resource Strain (CARDIA)    Difficulty of Paying Living Expenses: Not hard at all  Food Insecurity: No Food Insecurity (04/20/2024)   Hunger Vital Sign    Worried About Running Out of Food in the Last Year: Never true    Ran Out of Food in the Last Year: Never true  Transportation Needs: No Transportation Needs (04/20/2024)   PRAPARE - Administrator, Civil Service (Medical): No    Lack of Transportation (Non-Medical): No  Physical Activity: Inactive (04/20/2024)   Exercise Vital Sign    Days of Exercise per Week: 0 days    Minutes of Exercise per Session: 0 min  Stress: No Stress Concern Present (04/20/2024)   Joanna Fisher [employer] Occupational [employer] - Occupational Stress Questionnaire    Feeling of Stress: Not at all  Social Connections: Moderately Isolated (04/20/2024)   Social Connection and Isolation Panel    Frequency of Communication with Friends and Family: Once a week    Frequency of Social Gatherings with Friends and Family:  Never    Attends Religious Services: More than 4 times per year    Active Member of [employer] or Organizations: Yes    Attends Engineer, structural: More than 4 times per year    Marital Status: Never married    Tobacco Counseling Counseling given: Yes   Clinical Intake:   Pain : 0-10 Pain Score: 5  Pain Type: Acute pain Pain Location: Ear (patient has itching behind her right ear. started on antibiotics on 04/17/24) Pain Orientation: Right Pain Descriptors / Indicators: Discomfort Pain Onset: More than a month ago   BMI - recorded: 18.42 Nutritional Status: BMI <19  Underweight Nutritional Risks: None Diabetes: No Lab  Results  Component Value Date   HGBA1C 5.0 04/15/2023    How often do you need to have someone help you when you read instructions, pamphlets, or other written materials from your doctor or pharmacy?: 1 - Never   Information entered by :: Ailany Koren W CMA (AAMA)  Activities of Daily Living     04/20/2024    2:35 PM  In your present state of health, do you have any difficulty performing the following activities:  Hearing? 0  Vision? 0  Difficulty concentrating or making decisions? 0  Walking or climbing stairs? 0  Dressing or bathing? 0  Doing errands, shopping? 0  Preparing Food and eating ? Y  Comment patient does not prepare her own food but has no issues feeding herself  Using the Toilet? N  In the past six months, have you accidently leaked urine? N  Do you have problems with loss of bowel control? N  Managing your Medications? Y  Managing your Finances? Y  Housekeeping or managing your Housekeeping? Y   Patient Care Team: Zarwolo, Gloria, FNP as PCP - General (Family Medicine) [facility] as Consulting Physician (Ophthalmology) Octavia Bruckner, MD as Consulting Physician (Ophthalmology) Ozan, Jennifer, DO as Consulting Physician (Obstetrics and Gynecology)   I have updated your Care Teams any recent Medical Services  you may have received from other providers in the past year.     Assessment:   This is a routine wellness examination for Joanna Fisher.  Hearing/Vision screen Hearing Screening - Comments:: Patient denies any hearing difficulties.   Vision Screening - Comments:: Wears rx glasses - up to date with routine eye exams with  Dr. Octavia in [place]  Goals Addressed               This Visit's Progress     Go on a vacation (pt-stated)        [place]        Depression Screen     04/20/2024    2:30 PM 04/17/2024    2:18 PM 02/10/2024    1:54 PM 01/27/2024   11:26 AM 04/15/2023    9:38 AM 06/16/2022    1:41 PM 03/31/2022    9:06 AM  PHQ 2/9 Scores  PHQ - 2 Score 0 0 0 0 0 0 0  PHQ- 9 Score 0 0 0 0  0      Fall Risk     04/20/2024    2:34 PM 04/17/2024    2:18 PM 02/10/2024    1:54 PM 01/27/2024   11:26 AM 04/15/2023    9:38 AM  Fall Risk   Falls in the past year? 0 0 0 0 0  Number falls in past yr: 0 0 0 0 0  Injury with Fall? 0 0 0 0 0  Risk for fall due to : No Fall Risks No Fall Risks No Fall Risks No Fall Risks   Follow up Falls evaluation completed;Education provided;Falls prevention discussed Falls evaluation completed Falls evaluation completed Falls evaluation completed     MEDICARE RISK AT HOME:  Medicare Risk at Home Any stairs in or around the home?: Yes If so, are there any without handrails?: No Home free of loose throw rugs in walkways, pet beds, electrical cords, etc?: Yes Adequate lighting in your home to reduce risk of falls?: Yes Life alert?: No Use of a cane, walker or w/c?: No Grab bars in the bathroom?:  Yes Shower chair or bench in shower?: No Elevated toilet seat or a handicapped toilet?: Yes  TIMED UP AND GO: Was the test performed?  No  Cognitive Function: 6CIT completed        04/20/2024    2:37 PM 04/24/2021    2:33 PM  6CIT Screen  What Year? 0 points 0 points  What month? 0 points 0 points  What  time? 0 points 0 points  Count back from 20 0 points 0 points  Months in reverse 0 points 0 points  Repeat phrase 0 points 0 points  Total Score 0 points 0 points    Immunizations Immunization History  Administered Date(s) Administered   Influenza, Seasonal, Injecte, Preservative Fre 04/15/2023   Influenza,inj,Quad PF,6+ Mos 03/31/2022    Screening Tests Health Maintenance  Topic Date Due   HIV Screening  Never done   DTaP/Tdap/Td (1 - Tdap) Never done   Hepatitis B Vaccines 19-59 Average Risk (1 of 3 - 19+ 3-dose series) Never done   HPV VACCINES (1 - 3-dose SCDM series) Never done   Influenza Vaccine  02/25/2024   Medicare Annual Wellness (AWV)  04/20/2025   Cervical Cancer Screening (HPV/Pap Cotest)  02/20/2026   Hepatitis C Screening  Completed   Pneumococcal Vaccine  Aged Out   Meningococcal B Vaccine  Aged Out   COVID-19 Vaccine  Discontinued    Health Maintenance Health Maintenance Due  Topic Date Due   HIV Screening  Never done   DTaP/Tdap/Td (1 - Tdap) Never done   Hepatitis B Vaccines 19-59 Average Risk (1 of 3 - 19+ 3-dose series) Never done   HPV VACCINES (1 - 3-dose SCDM series) Never done   Influenza Vaccine  02/25/2024   Health Maintenance Items Addressed:  Patient scheduled for a nurse visit to get flu vaccine.    Additional Screening: Vision Screening: Recommended annual ophthalmology exams for early detection of glaucoma and other disorders of the eye. Would you like a referral to an eye doctor? No    Dental Screening: Recommended annual dental exams for proper oral hygiene  Community Resource Referral / Chronic Care Management: CRR required this visit?  No   CCM required this visit?  No  Plan:   I have personally reviewed and noted the following in the patient's chart:   Medical and social history Use of alcohol, tobacco or illicit drugs  Current medications and supplements including opioid prescriptions. Patient is not currently taking  opioid prescriptions. Functional ability and status Nutritional status Physical activity Advanced directives List of other physicians Hospitalizations, surgeries, and ER visits in previous 12 months Vitals Screenings to include cognitive, depression, and falls Referrals and appointments  In addition, I have reviewed and discussed with patient certain preventive protocols, quality metrics, and best practice recommendations. A written personalized care plan for preventive services as well as general preventive health recommendations were provided to patient.   Jamarco Zaldivar, CMA   04/20/2024   After Visit Summary: (Mail) Due to this being a telephonic visit, the after visit summary with patients personalized plan was offered to patient via mail   Notes: Please refer to Routing Comments.

## 2024-04-25 ENCOUNTER — Ambulatory Visit (INDEPENDENT_AMBULATORY_CARE_PROVIDER_SITE_OTHER): Payer: Self-pay

## 2024-04-25 DIAGNOSIS — Z23 Encounter for immunization: Secondary | ICD-10-CM | POA: Diagnosis not present

## 2024-04-25 NOTE — Progress Notes (Signed)
 Patient is in office today for a nurse visit for Blood Pressure Check and FLU SHOT. Patient Injection was given in the  Left deltoid. Patient tolerated injection well. and blood pressure was 96/64, Patient No chest pain, No shortness of breath, No dyspnea on exertion, No orthopnea, No paroxysmal nocturnal dyspnea, No edema, No palpitations, No syncope

## 2024-08-21 ENCOUNTER — Encounter: Admitting: Family Medicine

## 2024-10-04 ENCOUNTER — Encounter: Admitting: Nurse Practitioner

## 2025-04-24 ENCOUNTER — Ambulatory Visit
